# Patient Record
Sex: Male | Born: 1955 | Race: White | Hispanic: No | Marital: Married | Smoking: Never smoker
Health system: Southern US, Community
[De-identification: ages and names within clinical notes are randomized; demographics above are authoritative.]

## PROBLEM LIST (undated history)

## (undated) DIAGNOSIS — S62509A Fracture of unspecified phalanx of unspecified thumb, initial encounter for closed fracture: Secondary | ICD-10-CM

## (undated) DIAGNOSIS — I2510 Atherosclerotic heart disease of native coronary artery without angina pectoris: Secondary | ICD-10-CM

## (undated) DIAGNOSIS — F988 Other specified behavioral and emotional disorders with onset usually occurring in childhood and adolescence: Secondary | ICD-10-CM

## (undated) DIAGNOSIS — H3320 Serous retinal detachment, unspecified eye: Secondary | ICD-10-CM

## (undated) DIAGNOSIS — M5030 Other cervical disc degeneration, unspecified cervical region: Secondary | ICD-10-CM

## (undated) DIAGNOSIS — Z8249 Family history of ischemic heart disease and other diseases of the circulatory system: Secondary | ICD-10-CM

## (undated) DIAGNOSIS — N200 Calculus of kidney: Secondary | ICD-10-CM

## (undated) DIAGNOSIS — M5136 Other intervertebral disc degeneration, lumbar region: Secondary | ICD-10-CM

## (undated) DIAGNOSIS — N400 Enlarged prostate without lower urinary tract symptoms: Secondary | ICD-10-CM

## (undated) DIAGNOSIS — M51369 Other intervertebral disc degeneration, lumbar region without mention of lumbar back pain or lower extremity pain: Secondary | ICD-10-CM

## (undated) DIAGNOSIS — E7800 Pure hypercholesterolemia, unspecified: Secondary | ICD-10-CM

## (undated) DIAGNOSIS — R7303 Prediabetes: Secondary | ICD-10-CM

## (undated) DIAGNOSIS — Z8639 Personal history of other endocrine, nutritional and metabolic disease: Secondary | ICD-10-CM

## (undated) DIAGNOSIS — R896 Abnormal cytological findings in specimens from other organs, systems and tissues: Secondary | ICD-10-CM

## (undated) DIAGNOSIS — E875 Hyperkalemia: Secondary | ICD-10-CM

## (undated) DIAGNOSIS — R310 Gross hematuria: Secondary | ICD-10-CM

## (undated) DIAGNOSIS — R350 Frequency of micturition: Secondary | ICD-10-CM

## (undated) HISTORY — DX: Calculus of kidney: N20.0

## (undated) HISTORY — DX: Benign prostatic hyperplasia without lower urinary tract symptoms: N40.0

## (undated) HISTORY — DX: Family history of ischemic heart disease and other diseases of the circulatory system: Z82.49

## (undated) HISTORY — DX: Atherosclerotic heart disease of native coronary artery without angina pectoris: I25.10

## (undated) HISTORY — DX: Pure hypercholesterolemia, unspecified: E78.00

## (undated) HISTORY — DX: Serous retinal detachment, unspecified eye: H33.20

## (undated) HISTORY — DX: Hyperkalemia: E87.5

## (undated) HISTORY — DX: Other specified behavioral and emotional disorders with onset usually occurring in childhood and adolescence: F98.8

## (undated) HISTORY — DX: Fracture of unspecified phalanx of unspecified thumb, initial encounter for closed fracture: S62.509A

## (undated) HISTORY — PX: BIOPSY: SHX5522

## (undated) HISTORY — PX: OTHER SURGICAL HISTORY: SHX169

---

## 1992-09-23 HISTORY — PX: OTHER SURGICAL HISTORY: SHX169

## 1998-01-11 ENCOUNTER — Other Ambulatory Visit: Admission: RE | Admit: 1998-01-11 | Discharge: 1998-01-11 | Payer: Self-pay | Admitting: Gastroenterology

## 2010-10-24 HISTORY — PX: RETINAL DETACHMENT SURGERY: SHX105

## 2011-07-18 ENCOUNTER — Ambulatory Visit (HOSPITAL_COMMUNITY)
Admission: RE | Admit: 2011-07-18 | Discharge: 2011-07-18 | Disposition: A | Discharge status: Home / Self Care | Payer: PRIVATE HEALTH INSURANCE | Source: Ambulatory Visit | Attending: Urology | Admitting: Urology

## 2011-07-18 ENCOUNTER — Other Ambulatory Visit: Payer: Self-pay | Admitting: Urology

## 2011-07-18 DIAGNOSIS — R059 Cough, unspecified: Secondary | ICD-10-CM

## 2011-07-18 DIAGNOSIS — R05 Cough: Secondary | ICD-10-CM

## 2011-07-18 DIAGNOSIS — R079 Chest pain, unspecified: Secondary | ICD-10-CM | POA: Insufficient documentation

## 2011-07-18 DIAGNOSIS — M948X9 Other specified disorders of cartilage, unspecified sites: Secondary | ICD-10-CM | POA: Insufficient documentation

## 2011-07-25 HISTORY — PX: REFRACTIVE SURGERY: SHX103

## 2011-08-26 ENCOUNTER — Other Ambulatory Visit: Payer: Self-pay | Admitting: Urology

## 2011-08-27 ENCOUNTER — Encounter (HOSPITAL_BASED_OUTPATIENT_CLINIC_OR_DEPARTMENT_OTHER): Payer: Self-pay | Admitting: *Deleted

## 2011-08-27 NOTE — Progress Notes (Signed)
NPO AFTER MN. PT ARRIVES AT 1130. ( DR Laster TAKES HE WILL BE SEEING PT'S AT OFFICE DOS, SO POSS. FEW MIN'S LAST). NEEDS HG AND EKG.

## 2011-08-28 DIAGNOSIS — N200 Calculus of kidney: Secondary | ICD-10-CM | POA: Diagnosis present

## 2011-08-28 DIAGNOSIS — R972 Elevated prostate specific antigen [PSA]: Secondary | ICD-10-CM | POA: Diagnosis present

## 2011-08-28 DIAGNOSIS — R310 Gross hematuria: Secondary | ICD-10-CM | POA: Diagnosis present

## 2011-08-29 NOTE — H&P (Signed)
istory of Present Illness  Jerry Kirby is a 55 year old male patient who has a history of nephrolithiasis. He has passed stones on double occasions. Recently, after a long bike ride he experienced an episode of painless, gross hematuria. That occurred only one time but his urinalysis has revealed microscopic hematuria which has persisted. He has undergone multiple imaging studies including CT scans and ultrasounds which have revealed 3 stones in the left kidney a small stone in the right kidney and bilateral simple cysts. He has recently noted a mild shooting sensation down the urethra into the area of the tip of the penis and thought this could potentially be secondary to a stone either passing or lodged within the prostatic urethra. He does not have other voiding symptoms. He does have some prostatic calcification in the central zone to the right of the midline on previous CT scans.  His PSA had been slowly rising but well within the normal range. In 1/10 it had risen to 1.66. It was then noted to have decreased to 1.31 in 11/11 after a period of weight loss. His most recent PSA in 11/12 was noted to be elevated further to 2.35 however he had been undertaking vigorous rowing exercises on a daily basis prior to having his PSA performed. He has subsequently avoided further rowing exercises in order to determine if this was the cause of his slight PSA elevation. He does have a family history of prostate cancer in his father.   Past Medical History Problems  1. History of  Acute Sinusitis 461.9 2. History of  Hypercholesterolemia 272.0 3. History of  Retinal Detachment Left 361.9  Surgical History Problems  1. History of  Neurorrhaphy Left Thumb 2. History of  Repair Of Retinal Detachment By Laser Right 3. History of  Repair Retinal Detachment With Scleral Buckling With Implant Left 4. History of  Surgery Of Male Genitalia Vasectomy V25.2  Current Meds 1. Nystatin-Triamcinolone 100000-0.1 UNIT/GM-%  External Cream; APPLY SPARINGLY TO  AFFECTED AREA(S) TWICE DAILY; Therapy: 26Aug2008 to (Evaluate:03Sep2008); Last  Rx:26Aug2008  Allergies Medication  1. No Known Drug Allergies  Family History Problems  1. Maternal history of  Breast Cancer V16.3 2. Paternal history of  Coronary Artery Disease V17.49 3. Paternal history of  Malignant Melanoma Of The Skin V16.8 4. Paternal history of  Prostate Cancer V16.42  Social History Problems    Alcohol Use 1-2 drinks 3-5 nights weekly   Caffeine Use 2-5 daily   Marital History - Currently Married   Never A Smoker  Review of Systems Genitourinary, constitutional, skin, eye, otolaryngeal, hematologic/lymphatic, cardiovascular, pulmonary, endocrine, musculoskeletal, gastrointestinal, neurological and psychiatric system(s) were reviewed and pertinent findings if present are noted.  Genitourinary: hematuria and penile pain.  Musculoskeletal: shoulder pain.    Vitals Vital Signs [Data Includes: Last 1 Day]  12Nov2012 08:12AM  BMI Calculated: 25.33 BSA Calculated: 2.07 Height: 6 ft  Weight: 187 lb  Blood Pressure: 125 / 74 Temperature: 98.6 F Heart Rate: 74  Physical Exam: General appearance: alert and appears stated age Head: Normocephalic, without obvious abnormality, atraumatic Eyes: conjunctivae/corneas clear. EOM's intact.  Oropharynx: moist mucous membranes Neck: supple, symmetrical, trachea midline Resp: normal respiratory effort Cardio: regular rate and rhythm Back: symmetric, no curvature. ROM normal. No CVA tenderness. GI: soft, non-tender; bowel sounds normal; no masses,  no organomegaly Rectal: Rectal exam demonstrates an external hemorrhoid, but normal sphincter tone, no tenderness and no masses. The prostate is smooth and flat . Estimated prostate size is 1+. The prostate  has no nodularity and is not tender. The left seminal vesicle is nonpalpable. The right seminal vesicle is nonpalpable. The perineum is normal on  inspection.  Extremities: extremities normal, atraumatic, no cyanosis or edema Skin: Skin color normal. No visible rashes or lesions Neurologic: Grossly normal  Impression/plan: Since he has had gross hematuria as well as to cytology is that showed atypia we have discussed further evaluation with cystoscopy, bilateral retrograde pyelograms and bilateral ureteroscopy. He has known left renal calculi which I will address ureteroscopically. Their size will undoubtedly necessitate laser lithotripsy. I will also be able to ureteroscopically evaluate the collecting system and ureter for a possible source of gross hematuria other than his stones.In addition his PSA has increased significantly and although he has an unremarkable DRE I have recommended further evaluation with transrectal ultrasound and biopsy of the prostate. We discussed these procedures in detail and he is fully informed of the risks, complications and alternatives as well as probability of success.

## 2011-08-30 ENCOUNTER — Other Ambulatory Visit: Payer: Self-pay | Admitting: Urology

## 2011-08-30 ENCOUNTER — Encounter (HOSPITAL_BASED_OUTPATIENT_CLINIC_OR_DEPARTMENT_OTHER)
Admission: RE | Disposition: A | Discharge status: Home / Self Care | Payer: Self-pay | Source: Ambulatory Visit | Attending: Urology

## 2011-08-30 ENCOUNTER — Encounter (HOSPITAL_BASED_OUTPATIENT_CLINIC_OR_DEPARTMENT_OTHER): Payer: Self-pay | Admitting: Anesthesiology

## 2011-08-30 ENCOUNTER — Ambulatory Visit (HOSPITAL_BASED_OUTPATIENT_CLINIC_OR_DEPARTMENT_OTHER)
Admission: RE | Admit: 2011-08-30 | Discharge: 2011-08-30 | Disposition: A | Discharge status: Home / Self Care | Payer: PRIVATE HEALTH INSURANCE | Source: Ambulatory Visit | Attending: Urology | Admitting: Urology

## 2011-08-30 ENCOUNTER — Encounter (HOSPITAL_BASED_OUTPATIENT_CLINIC_OR_DEPARTMENT_OTHER): Payer: Self-pay | Admitting: *Deleted

## 2011-08-30 ENCOUNTER — Ambulatory Visit (HOSPITAL_BASED_OUTPATIENT_CLINIC_OR_DEPARTMENT_OTHER): Payer: PRIVATE HEALTH INSURANCE | Admitting: Anesthesiology

## 2011-08-30 DIAGNOSIS — N200 Calculus of kidney: Secondary | ICD-10-CM | POA: Diagnosis present

## 2011-08-30 DIAGNOSIS — N135 Crossing vessel and stricture of ureter without hydronephrosis: Secondary | ICD-10-CM | POA: Insufficient documentation

## 2011-08-30 DIAGNOSIS — R310 Gross hematuria: Secondary | ICD-10-CM | POA: Diagnosis present

## 2011-08-30 DIAGNOSIS — R972 Elevated prostate specific antigen [PSA]: Secondary | ICD-10-CM | POA: Diagnosis present

## 2011-08-30 DIAGNOSIS — R6889 Other general symptoms and signs: Secondary | ICD-10-CM | POA: Insufficient documentation

## 2011-08-30 DIAGNOSIS — E7800 Pure hypercholesterolemia, unspecified: Secondary | ICD-10-CM | POA: Insufficient documentation

## 2011-08-30 HISTORY — DX: Abnormal cytological findings in specimens from other organs, systems and tissues: R89.6

## 2011-08-30 HISTORY — DX: Other intervertebral disc degeneration, lumbar region without mention of lumbar back pain or lower extremity pain: M51.369

## 2011-08-30 HISTORY — DX: Other intervertebral disc degeneration, lumbar region: M51.36

## 2011-08-30 HISTORY — PX: CYSTOSCOPY W/ URETERAL STENT PLACEMENT: SHX1429

## 2011-08-30 HISTORY — DX: Other cervical disc degeneration, unspecified cervical region: M50.30

## 2011-08-30 HISTORY — DX: Frequency of micturition: R35.0

## 2011-08-30 HISTORY — DX: Calculus of kidney: N20.0

## 2011-08-30 HISTORY — DX: Gross hematuria: R31.0

## 2011-08-30 HISTORY — PX: PROSTATE BIOPSY: SHX241

## 2011-08-30 LAB — POCT HEMOGLOBIN-HEMACUE: Hemoglobin: 15.1 g/dL (ref 13.0–17.0)

## 2011-08-30 SURGERY — CYSTOSCOPY, WITH RETROGRADE PYELOGRAM AND URETERAL STENT INSERTION
Anesthesia: General | Site: Ureter | Wound class: Clean Contaminated

## 2011-08-30 MED ORDER — LIDOCAINE HCL 2 % EX GEL
CUTANEOUS | Status: DC | PRN
  Administered 2011-08-30: 1

## 2011-08-30 MED ORDER — TAPENTADOL HCL 50 MG PO TABS
50.0000 mg | ORAL_TABLET | Freq: Once | ORAL | Status: AC
  Administered 2011-08-30: 50 mg via ORAL

## 2011-08-30 MED ORDER — STERILE WATER FOR IRRIGATION IR SOLN
Status: DC | PRN
  Administered 2011-08-30: 3000 mL

## 2011-08-30 MED ORDER — PIPERACILLIN-TAZOBACTAM 3.375 G IVPB 30 MIN
3.3750 g | Freq: Once | INTRAVENOUS | Status: AC
  Administered 2011-08-30: 3.375 g via INTRAVENOUS

## 2011-08-30 MED ORDER — DEXAMETHASONE SODIUM PHOSPHATE 4 MG/ML IJ SOLN
INTRAMUSCULAR | Status: DC | PRN
  Administered 2011-08-30: 8 mg via INTRAVENOUS

## 2011-08-30 MED ORDER — LIDOCAINE HCL (CARDIAC) 20 MG/ML IV SOLN
INTRAVENOUS | Status: DC | PRN
  Administered 2011-08-30: 60 mg via INTRAVENOUS

## 2011-08-30 MED ORDER — GENTAMICIN IN SALINE 0.8-0.9 MG/ML-% IV SOLN
80.0000 mg | Freq: Once | INTRAVENOUS | Status: AC
  Administered 2011-08-30: 80 mg via INTRAVENOUS

## 2011-08-30 MED ORDER — ACETAMINOPHEN 10 MG/ML IV SOLN: 1000.0000 mg | Freq: Four times a day (QID) | INTRAVENOUS | Status: DC

## 2011-08-30 MED ORDER — PHENAZOPYRIDINE HCL 200 MG PO TABS: 200.0000 mg | ORAL_TABLET | Freq: Three times a day (TID) | ORAL | Status: AC | PRN

## 2011-08-30 MED ORDER — IOHEXOL 350 MG/ML SOLN
INTRAVENOUS | Status: DC | PRN
  Administered 2011-08-30: 50 mL

## 2011-08-30 MED ORDER — FENTANYL CITRATE 0.05 MG/ML IJ SOLN
INTRAMUSCULAR | Status: DC | PRN
  Administered 2011-08-30: 25 ug via INTRAVENOUS
  Administered 2011-08-30 (×3): 50 ug via INTRAVENOUS
  Administered 2011-08-30 (×2): 25 ug via INTRAVENOUS
  Administered 2011-08-30: 50 ug via INTRAVENOUS
  Administered 2011-08-30 (×3): 25 ug via INTRAVENOUS

## 2011-08-30 MED ORDER — LACTATED RINGERS IV SOLN
INTRAVENOUS | Status: DC
  Administered 2011-08-30 (×2): via INTRAVENOUS
  Administered 2011-08-30: 100 mL/h via INTRAVENOUS
  Administered 2011-08-30: 14:00:00 via INTRAVENOUS

## 2011-08-30 MED ORDER — LEVOFLOXACIN 500 MG PO TABS: 500.0000 mg | ORAL_TABLET | Freq: Every day | ORAL | Status: AC

## 2011-08-30 MED ORDER — TAPENTADOL HCL 75 MG PO TABS: 75.0000 mg | ORAL_TABLET | Freq: Four times a day (QID) | ORAL | Status: DC | PRN

## 2011-08-30 MED ORDER — MIDAZOLAM HCL 5 MG/5ML IJ SOLN
INTRAMUSCULAR | Status: DC | PRN
  Administered 2011-08-30: 2 mg via INTRAVENOUS

## 2011-08-30 MED ORDER — KETOROLAC TROMETHAMINE 30 MG/ML IJ SOLN
30.0000 mg | Freq: Once | INTRAMUSCULAR | Status: AC
  Administered 2011-08-30: 30 mg via INTRAVENOUS

## 2011-08-30 MED ORDER — SODIUM CHLORIDE 0.9 % IR SOLN
Status: DC | PRN
  Administered 2011-08-30: 500 mL

## 2011-08-30 MED ORDER — BELLADONNA ALKALOIDS-OPIUM 16.2-60 MG RE SUPP
RECTAL | Status: DC | PRN
  Administered 2011-08-30: 1 via RECTAL

## 2011-08-30 MED ORDER — SODIUM CHLORIDE 0.9 % IR SOLN
Status: DC | PRN
  Administered 2011-08-30: 6000 mL

## 2011-08-30 MED ORDER — ACETAMINOPHEN 10 MG/ML IV SOLN
1000.0000 mg | Freq: Once | INTRAVENOUS | Status: AC
  Administered 2011-08-30: 1000 mg via INTRAVENOUS

## 2011-08-30 MED ORDER — PROPOFOL 10 MG/ML IV EMUL
INTRAVENOUS | Status: DC | PRN
  Administered 2011-08-30: 200 mg via INTRAVENOUS

## 2011-08-30 MED ORDER — FENTANYL CITRATE 0.05 MG/ML IJ SOLN
25.0000 ug | INTRAMUSCULAR | Status: DC | PRN
  Administered 2011-08-30: 25 ug via INTRAVENOUS
  Administered 2011-08-30: 50 ug via INTRAVENOUS
  Administered 2011-08-30: 25 ug via INTRAVENOUS

## 2011-08-30 MED ORDER — PROMETHAZINE HCL 25 MG/ML IJ SOLN: 6.2500 mg | INTRAMUSCULAR | Status: DC | PRN

## 2011-08-30 MED ORDER — PHENAZOPYRIDINE HCL 200 MG PO TABS
200.0000 mg | ORAL_TABLET | Freq: Once | ORAL | Status: AC
  Administered 2011-08-30: 200 mg via ORAL

## 2011-08-30 MED ORDER — OXYBUTYNIN CHLORIDE 5 MG PO TABS
5.0000 mg | ORAL_TABLET | Freq: Once | ORAL | Status: AC
  Administered 2011-08-30: 5 mg via ORAL

## 2011-08-30 MED ORDER — ONDANSETRON HCL 4 MG/2ML IJ SOLN
INTRAMUSCULAR | Status: DC | PRN
  Administered 2011-08-30: 4 mg via INTRAVENOUS

## 2011-08-30 MED ORDER — TAMSULOSIN HCL 0.4 MG PO CAPS
0.4000 mg | ORAL_CAPSULE | Freq: Once | ORAL | Status: AC
  Administered 2011-08-30: 0.4 mg via ORAL

## 2011-08-30 SURGICAL SUPPLY — 34 items
ADAPTER CATH URET PLST 4-6FR (CATHETERS) ×1 IMPLANT
ADPR CATH URET STRL DISP 4-6FR (CATHETERS) ×3
BAG DRAIN URO-CYSTO SKYTR STRL (DRAIN) ×4 IMPLANT
BAG DRN UROCATH (DRAIN) ×3
BASKET ZERO TIP NITINOL 2.4FR (BASKET) ×1 IMPLANT
BSKT STON RTRVL ZERO TP 2.4FR (BASKET) ×3
CANISTER SUCT LVC 12 LTR MEDI- (MISCELLANEOUS) IMPLANT
CATH INTERMIT  6FR 70CM (CATHETERS) IMPLANT
CATH URET 5FR 28IN CONE TIP (BALLOONS) ×1
CATH URET 5FR 70CM CONE TIP (BALLOONS) IMPLANT
CLOTH BEACON ORANGE TIMEOUT ST (SAFETY) ×4 IMPLANT
CONT SPEC 4OZ CLIKSEAL STRL BL (MISCELLANEOUS) ×3 IMPLANT
DRAPE CAMERA CLOSED 9X96 (DRAPES) ×4 IMPLANT
DRESSING TELFA 8X3 (GAUZE/BANDAGES/DRESSINGS) ×3 IMPLANT
GLOVE BIO SURGEON STRL SZ8 (GLOVE) ×4 IMPLANT
GOWN PREVENTION PLUS LG XLONG (DISPOSABLE) ×5 IMPLANT
GOWN STRL REIN XL XLG (GOWN DISPOSABLE) ×4 IMPLANT
GUIDEWIRE 0.038 PTFE COATED (WIRE) ×4 IMPLANT
GUIDEWIRE ANG ZIPWIRE 038X150 (WIRE) ×1 IMPLANT
GUIDEWIRE STR DUAL SENSOR (WIRE) ×1 IMPLANT
IV NS IRRIG 3000ML ARTHROMATIC (IV SOLUTION) ×2 IMPLANT
KIT BALLIN UROMAX 15FX10 (LABEL) IMPLANT
LASER FIBER DISP (UROLOGICAL SUPPLIES) ×2 IMPLANT
NS IRRIG 500ML POUR BTL (IV SOLUTION) ×1 IMPLANT
PACK CYSTOSCOPY (CUSTOM PROCEDURE TRAY) ×4 IMPLANT
SET HIGH PRES BAL DIL (LABEL) ×1
SHEATH ACCESS URETERAL 38CM (SHEATH) ×1 IMPLANT
STENT POLARIS 5FRX26 (STENTS) ×2 IMPLANT
SURGILUBE 2OZ TUBE FLIPTOP (MISCELLANEOUS) ×4 IMPLANT
SYRINGE IRR TOOMEY STRL 70CC (SYRINGE) ×1 IMPLANT
TOWEL OR 17X24 6PK STRL BLUE (TOWEL DISPOSABLE) ×4 IMPLANT
UNDERPAD 30X30 INCONTINENT (UNDERPADS AND DIAPERS) ×4 IMPLANT
WATER STERILE IRR 3000ML UROMA (IV SOLUTION) ×1 IMPLANT
WATER STERILE IRR 500ML POUR (IV SOLUTION) ×4 IMPLANT

## 2011-08-30 NOTE — Progress Notes (Signed)
Glasses returned to pt 

## 2011-08-30 NOTE — Op Note (Signed)
PATIENT:  Jerry Kirby  PRE-OPERATIVE DIAGNOSIS: 1. Gross hematuria. 2. Elevated PSA. 3. Bilateral renal calculi. 4. Abnormal cytology.  POST-OPERATIVE DIAGNOSIS: Same and right ureteral stricture  PROCEDURE: 1. Transrectal ultrasound and biopsy of the prostate. 2. Cystoscopy with bilateral retrograde pyelograms including interpretation. 3. Bilateral ureteroscopy. 4. Bilateral renal stone extraction with left renallaser lithotripsy. 5. Bilateral ureteral stent placement. 6. Bladder biopsy. 7. Right ureteral stricture.  SURGEON: Garnett Farm, MD  INDICATION: The patient is a 55 year old male who has experienced a recent significant elevation of his PSA. He was found on DRE to have no abnormality. A course of antibiotics has not resulted in any significant decrease of his PSA despite the fact that he was having some very mild perineal and penile urethral symptoms suggesting possible prostatitis. In addition he has had 2 episodes of gross hematuria. He does have a history of calculus disease and has passed stones previously however he has not seen any stones past recently associated or subsequent to his episodes of gross hematuria. A CT scan was obtained that revealed a very subtle irregularity of the collecting system on the right-hand side but otherwise no other abnormality of the collecting systems or ureters were identified. He has bilateral stones but has 3 stones in his left kidney and has passed multiple stones all from the left-hand side. He is brought to the operating room today for evaluation of his prostate with transrectal ultrasound and biopsy, evaluation of the bladder and ureters with cystoscopy and bilateral retrograde pyelography, evaluation of the intrarenal collecting systems with bilateral ureteroscopy and treatment of his left renal calculi.  ANESTHESIA:  General  EBL:  Minimal  DRAINS: 5 French, 26 cm Polaris stent in each ureter (no string)  SPECIMEN:  1. Cold cup  biopsy of posterior wall of the bladder. 2. Barbotage specimen from the bladder for cytology. 3. Barbotage specimen from the right renal pelvis and left renal pelvis for cytology. 4. Prostate biopsy needle cores to pathology.  DISPOSITION OF SPECIMEN:  PATHOLOGY  DESCRIPTION OF PROCEDURE: The patient was taken to the major OR and while on the stretcher was administered general LMA anesthesia. An official time out was then performed.  He was then moved to the left lateral decubitus position. The transrectal ultrasound probe was placed in the rectum and real-time ultrasonography was performed. I noted the prostate appeared entirely normal with some slight hypertrophy of the transition zone but no hyper or hypoechoic lesions in the peripheral zone. There was an area of calcification in the apex of the prostate on the right-hand side. The prostate volume measured 41.53 cc with a width of 5.20 cm a height of 3.37 cm and length of 4.52 cm. Transrectal ultrasound guidance was then used to perform prostate biopsies. 12 cores were obtained in the standard fashion with an extra core being obtained from the right apical region through the area of calcification. This was well-tolerated with no bleeding noted at the end of the procedure.  Initially the 22 French cystoscope with 12 lens was passed under direct vision down the urethra with no abnormality of the urethra being noted. The prostatic urethra had no stones or abnormality. There was some slight elongation of the prostatic urethra. Bladder was then entered and systematically inspected. There were no tumors stones or inflammatory lesions seen. The ureteral orifices were noted the abnormal configuration and position. There was an area on the posterior wall the bladder that just appeared slightly redder than the surrounding mucosa and therefore  I elected to proceed with a cold cup biopsy of this area. The cold cup biopsy forceps were passed into the bladder and a  biopsy of the lesion which measured approximately 2-3 mm was performed. I then used a Bugbee electrode to fulgurate the area.  Before performing this after I had initially pass the cystoscope into the bladder I noted the bladder was relatively full of urine and therefore I used a Toomey syringe and barbotaged the bladder. This specimen was then sent for cytology.  A retrograde pyelogram was performed by injecting full-strength contrast up the left ureter under direct fluoroscopic control. It revealed No abnormality of the ureter throughout its entire length up to the area of the renal pelvis which was noted to be normal in the calyceal system appeared normal and I could not identify any definite filling defects or mass effect. I then performed an identical procedure on the right-hand side again using a 6 [place] open-ended ureteral catheter and noted the ureter and renal collecting system appeared entirely normal. I then passed a 0.038 inch floppy-tipped guidewire through the open-ended catheter up the right ureter. I passed the open-ended catheter over the guidewire into the area of the renal pelvis and removed all of the contrast in the area the renal pelvis. I then used approximately 10 cc of saline and barbotaged the right renal pelvis and this was sent for cytology. I left the guidewire in place and turned my attention to the left ureteral orifice. A open-ended catheter was then passed into the orifice, a guidewire up the ureter and the open-ended stent into the area the renal pelvis. I removed all of the contrast from the renal pelvis and barbotaged the renal pelvis with 10 cc of saline and sent this also for cytology.  I then used the inner portion of ureteral access sheath to gently dilate the intramural ureter after which I passed the 6 French flexible ureteroscope over the guidewire into the left ureter and was able to pass about three quarters of the way up the ureter but was unable to get it beyond  this location. I was able to visualize the area and it appeared to be slightly narrowed but there appeared to be no abnormality of the mucosa. It appeared that this was a mild stricture and I was unable to dilate it with the access sheath. I therefore left the guidewire in place and passed a balloon dilator over the guidewire and beyond the area of stricturing and inflated the balloon. A wasting area of the balloon was identified as I feel this and it was documented with fluoroscopic images. I then left the guidewire in place and removed the dilating balloon and was easily able to pass the ureteroscope over the guidewire into the right kidney. I removed the guidewire and then performed a systematic inspection of the entire collecting system as well as renal pelvis. I entered each calyx and noted no abnormality in a middle pole calyx the stone was identified. It completed my survey of all the calyces and the renal pelvis as well as UPJ region and noted no abnormality. I therefore passed a Nitinol basket through the ureteroscope and was able to grasp the stone and extract it without difficulty. With the guidewire in place I backloaded the cystoscope over the guidewire and then passed the stent over the guidewire into the area the renal pelvis. As I removed the guidewire a good curl was noted in the area the renal pelvis.  I then  directed my attention to the left ureter. A guidewire was passed near the renal pelvis and the ureteral access sheath was then passed over the guidewire and up the ureter. The inner portion of the access sheath as well as guidewire were removed and I passed the ureteroscope through the access sheath into the renal pelvis. I was able to visualize the renal pelvis and noted no abnormality. I then did a systematic inspection of each calyx and found no abnormality in any of the calyces to suggest any form of neoplastic process. I did note a stone in a middle pole calyx as well as a second middle  pole calyx and what appeared to be a lower pole calyx. I passed a 200 Micron holmium laser fiber through the ureteroscope and was able to fragment 2 of the largest stones. I then passed a Nitinol basket through the ureteroscope and extracted the large portions of the stone. I also extracted the third stone which was smaller and I was able to remove it completely. I then reinspected each calyx and noted submillimeter fragments of stone remaining but otherwise all significant stone fragments had been removed. There was no significant bleeding noted. I therefore passed the guidewire through the ureteroscope into the renal pelvis, removed the ureteroscope and backloaded the cystoscope. I then passed a second ureteral stent over the guidewire into the renal pelvis and is a remove the guidewire on the left-hand side there was good curl noted on that side as well. The bladder was then drained and the cystoscope was removed.  I then placed 2% lidocaine jelly in the urethra and applied a penile clamp. The patient was awakened and taken to recovery room in stable and satisfactory condition. He tolerated the procedure well with no intraoperative complications.  PLAN OF CARE: Discharge to home after PACU  PATIENT DISPOSITION:  PACU - hemodynamically stable.

## 2011-08-30 NOTE — Anesthesia Preprocedure Evaluation (Signed)
Anesthesia Evaluation  Patient identified by MRN, date of birth, ID band Patient awake    Reviewed: Allergy & Precautions, H&P , NPO status , Patient's Chart, lab work & pertinent test results  Airway Mallampati: II TM Distance: >3 FB Neck ROM: Full    Dental No notable dental hx.    Pulmonary neg pulmonary ROS,  clear to auscultation  Pulmonary exam normal       Cardiovascular neg cardio ROS Regular Normal    Neuro/Psych Negative Neurological ROS  Negative Psych ROS   GI/Hepatic negative GI ROS, Neg liver ROS,   Endo/Other  Negative Endocrine ROS  Renal/GU negative Renal ROS  Genitourinary negative   Musculoskeletal negative musculoskeletal ROS (+)   Abdominal   Peds negative pediatric ROS (+)  Hematology negative hematology ROS (+)   Anesthesia Other Findings   Reproductive/Obstetrics negative OB ROS                           Anesthesia Physical Anesthesia Plan  ASA: I  Anesthesia Plan: General   Post-op Pain Management:    Induction: Intravenous  Airway Management Planned: LMA  Additional Equipment:   Intra-op Plan:   Post-operative Plan: Extubation in OR  Informed Consent: I have reviewed the patients History and Physical, chart, labs and discussed the procedure including the risks, benefits and alternatives for the proposed anesthesia with the patient or authorized representative who has indicated his/her understanding and acceptance.   Dental advisory given  Plan Discussed with: CRNA  Anesthesia Plan Comments:         Anesthesia Quick Evaluation  

## 2011-08-30 NOTE — Anesthesia Procedure Notes (Signed)
Procedure Name: LMA Insertion Date/Time: 08/30/2011 12:38 PM Performed by: Renella Cunas D Pre-anesthesia Checklist: Patient identified, Emergency Drugs available, Suction available and Patient being monitored Patient Re-evaluated:Patient Re-evaluated prior to inductionOxygen Delivery Method: Circle System Utilized Preoxygenation: Pre-oxygenation with 100% oxygen Intubation Type: IV induction Ventilation: Mask ventilation without difficulty LMA: LMA inserted LMA Size: 4.0 Number of attempts: 1 Placement Confirmation: positive ETCO2 Tube secured with: Tape Dental Injury: Teeth and Oropharynx as per pre-operative assessment

## 2011-08-30 NOTE — Progress Notes (Signed)
asisted oob to br.  Unable to void.  C/o spasms in rlg.  Fentanyl given after return to recliner chair.  Will monitor.

## 2011-08-30 NOTE — Anesthesia Postprocedure Evaluation (Signed)
  Anesthesia Post-op Note  Patient: Jerry Kirby  Procedure(s) Performed:  CYSTOSCOPY WITH RETROGRADE PYELOGRAM/URETERAL STENT PLACEMENT; PROSTATE BIOPSY; BIOPSY - Bladder Biopsy; HOLMIUM LASER APPLICATION  Patient Location: PACU  Anesthesia Type: General  Level of Consciousness: awake and alert   Airway and Oxygen Therapy: Patient Spontanous Breathing  Post-op Pain: mild  Post-op Assessment: Post-op Vital signs reviewed, Patient's Cardiovascular Status Stable, Respiratory Function Stable, Patent Airway and No signs of Nausea or vomiting  Post-op Vital Signs: stable  Complications: No apparent anesthesia complications

## 2011-08-30 NOTE — Progress Notes (Signed)
Reported to M. Dino Borntreger, RN as caregiver 

## 2011-08-30 NOTE — Transfer of Care (Signed)
Immediate Anesthesia Transfer of Care Note  Patient: Jerry Kirby  Procedure(s) Performed:  CYSTOSCOPY WITH RETROGRADE PYELOGRAM/URETERAL STENT PLACEMENT; PROSTATE BIOPSY; BIOPSY - Bladder Biopsy; HOLMIUM LASER APPLICATION  Patient Location: PACU  Anesthesia Type: General  Level of Consciousness: awake, oriented, sedated and patient cooperative  Airway & Oxygen Therapy: Patient Spontanous Breathing and Patient connected to face mask oxygen  Post-op Assessment: Report given to PACU RN and Post -op Vital signs reviewed and stable  Post vital signs: Reviewed and stable  Complications: No apparent anesthesia complications

## 2011-08-30 NOTE — OR Nursing (Addendum)
Stent placed in right ureter by Dr. Vernie Ammons without string.

## 2011-08-30 NOTE — Progress Notes (Signed)
Pt still  C/o severe spasms.  Dr. Vernie Ammons paged via beeper. Informed of tx wi strong urgency still.  Orders received for flomax and ditropan.  Pt assisted to br.

## 2011-08-30 NOTE — OR Nursing (Signed)
Stent placed in left ureter without string by Dr. Vernie Ammons.

## 2011-09-02 ENCOUNTER — Encounter (HOSPITAL_BASED_OUTPATIENT_CLINIC_OR_DEPARTMENT_OTHER): Payer: Self-pay | Admitting: Urology

## 2011-09-05 ENCOUNTER — Other Ambulatory Visit: Payer: Self-pay | Admitting: Urology

## 2011-09-13 ENCOUNTER — Encounter (HOSPITAL_BASED_OUTPATIENT_CLINIC_OR_DEPARTMENT_OTHER): Admission: RE | Payer: Self-pay | Source: Ambulatory Visit

## 2011-09-13 ENCOUNTER — Ambulatory Visit (HOSPITAL_BASED_OUTPATIENT_CLINIC_OR_DEPARTMENT_OTHER): Admission: RE | Admit: 2011-09-13 | Payer: PRIVATE HEALTH INSURANCE | Source: Ambulatory Visit | Admitting: Urology

## 2011-09-13 SURGERY — TRANSURETHRAL RESECTION OF THE PROSTATE WITH GYRUS INSTRUMENTS
Anesthesia: General

## 2011-11-21 ENCOUNTER — Other Ambulatory Visit: Payer: Self-pay | Admitting: Urology

## 2011-11-21 ENCOUNTER — Encounter (HOSPITAL_COMMUNITY): Payer: Self-pay | Admitting: *Deleted

## 2011-11-21 NOTE — Pre-Procedure Instructions (Signed)
Patient informed to bring blue folder, driver, insurance info, picture ID, to arrive in [facility] at 1300 on March 7.Npo for solid food at midnight, then clear liquids until 9am then npo except meds with a sip. No aspirin, ibuprofen, toradol 72 hours prior to procedure.to follow laxative instructions in blue folder. Patient verbalized understanding of instructions.

## 2011-11-27 ENCOUNTER — Encounter (HOSPITAL_COMMUNITY): Payer: Self-pay | Admitting: Pharmacy Technician

## 2011-11-27 NOTE — H&P (Signed)
History of Present Illness  Jerry Kirby is a 56 year old male patient who has a history of nephrolithiasis. He has passed stones on double occasions. Recently, after a long bike ride he experienced an episode of painless, gross hematuria. That occurred only one time but his urinalysis has revealed microscopic hematuria which has persisted. He has undergone multiple imaging studies including CT scans and ultrasounds which have revealed 3 stones in the left kidney a small stone in the right kidney and bilateral simple cysts. He has recently noted a mild shooting sensation down the urethra into the area of the tip of the penis and thought this could potentially be secondary to a stone either passing or lodged within the prostatic urethra. He does not have other voiding symptoms. He does have some prostatic calcification in the central zone to the right of the midline on previous CT scans.  His PSA had been slowly rising but well within the normal range. In 1/10 it had risen to 1.66. It was then noted to have decreased to 1.31 in 11/11 after a period of weight loss. His most recent PSA in 11/12 was noted to be elevated further to 2.35 however he had been undertaking vigorous rowing exercises on a daily basis prior to having his PSA performed. He has subsequently avoided further rowing exercises in order to determine if this was the cause of his slight PSA elevation. He does have a family history of prostate cancer in his father.   He underwent bilateral ureteroscopy with treatment and removal of bilateral nephrolithiasis as well as a prostate biopsy. Postoperatively he had a great deal of discomfort from his double-J stent and they remain indwelling only 3 days and were removed despite this fact he developed calcifications alonghis right ureteral stent and has passed some of the calcifications. They were analyze and found to be of a different composition than his original renal calculi which were calcium oxalate. The  stones that formed on his stent were found to be calcium phosphate. He has several fragnts in his upper ureter that persist and have been causing intermittent discomfort.  Past Medical History Problems  1. History of  Acute Sinusitis 461.9 2. History of  Hypercholesterolemia 272.0 3. History of  Retinal Detachment Left 361.9  Surgical History Problems  1. History of  Neurorrhaphy Left Thumb 2. History of  Repair Of Retinal Detachment By Laser Right 3. History of  Repair Retinal Detachment With Scleral Buckling With Implant Left 4. History of  Surgery Of Male Genitalia Vasectomy V25.2  Current Meds 1. Nystatin-Triamcinolone 100000-0.1 UNIT/GM-% External Cream; APPLY SPARINGLY TO  AFFECTED AREA(S) TWICE DAILY; Therapy: 26Aug2008 to (Evaluate:03Sep2008); Last  Rx:26Aug2008  Allergies Medication  1. No Known Drug Allergies  Family History Problems  1. Maternal history of  Breast Cancer V16.3 2. Paternal history of  Coronary Artery Disease V17.49 3. Paternal history of  Malignant Melanoma Of The Skin V16.8 4. Paternal history of  Prostate Cancer V16.42  Social History Problems    Alcohol Use 1-2 drinks 3-5 nights weekly   Caffeine Use 2-5 daily   Marital History - Currently Married   Never A Smoker  Review of Systems Genitourinary, constitutional, skin, eye, otolaryngeal, hematologic/lymphatic, cardiovascular, pulmonary, endocrine, musculoskeletal, gastrointestinal, neurological and psychiatric system(s) were reviewed and pertinent findings if present are noted.  Genitourinary: hematuria and penile pain.  Musculoskeletal: shoulder pain.   Physical Exam: General appearance: alert and appears stated age Head: Normocephalic, without obvious abnormality, atraumatic Eyes: conjunctivae/corneas clear. EOM's intact.  Oropharynx:  moist mucous membranes Neck: supple, symmetrical, trachea midline Resp: normal respiratory effort Cardio: regular rate and rhythm Back: symmetric,  no curvature. ROM normal. No CVA tenderness. GI: soft, non-tender; bowel sounds normal; no masses,  no organomegaly Male genitalia: penis: normal male phallus with no lesions or discharge. Testes: bilaterally descended with no masses or tenderness. no hernia Extremities: extremities normal, atraumatic, no cyanosis or edema Skin: Skin color normal. No visible rashes or lesions Neurologic: Grossly normal  Impression: Multiple small stones that have failed to progress are located in his upper right ureter we therefore have discussed treating these with lithotripsy.  Plan: Lithotripsy of his right upper ureteral calculi

## 2011-11-28 ENCOUNTER — Ambulatory Visit (HOSPITAL_COMMUNITY)
Admission: RE | Admit: 2011-11-28 | Discharge: 2011-11-28 | Disposition: A | Discharge status: Home / Self Care | Payer: PRIVATE HEALTH INSURANCE | Source: Ambulatory Visit | Attending: Urology | Admitting: Urology

## 2011-11-28 ENCOUNTER — Encounter (HOSPITAL_COMMUNITY): Payer: Self-pay

## 2011-11-28 ENCOUNTER — Encounter (HOSPITAL_COMMUNITY)
Admission: RE | Disposition: A | Discharge status: Home / Self Care | Payer: Self-pay | Source: Ambulatory Visit | Attending: Urology

## 2011-11-28 DIAGNOSIS — E7800 Pure hypercholesterolemia, unspecified: Secondary | ICD-10-CM | POA: Insufficient documentation

## 2011-11-28 DIAGNOSIS — Z8042 Family history of malignant neoplasm of prostate: Secondary | ICD-10-CM | POA: Insufficient documentation

## 2011-11-28 DIAGNOSIS — N201 Calculus of ureter: Secondary | ICD-10-CM | POA: Insufficient documentation

## 2011-11-28 SURGERY — LITHOTRIPSY, ESWL
Anesthesia: LOCAL | Laterality: Right

## 2011-11-28 MED ORDER — SODIUM CHLORIDE 0.9 % IV SOLN
INTRAVENOUS | Status: DC
  Administered 2011-11-28 (×2): via INTRAVENOUS

## 2011-11-28 MED ORDER — DIAZEPAM 5 MG PO TABS
ORAL_TABLET | ORAL | Status: AC
  Administered 2011-11-28: 10 mg via ORAL
  Filled 2011-11-28: qty 2

## 2011-11-28 MED ORDER — DIAZEPAM 5 MG PO TABS
10.0000 mg | ORAL_TABLET | ORAL | Status: AC
  Administered 2011-11-28: 10 mg via ORAL

## 2011-11-28 MED ORDER — DIPHENHYDRAMINE HCL 25 MG PO CAPS
25.0000 mg | ORAL_CAPSULE | ORAL | Status: AC
  Administered 2011-11-28: 25 mg via ORAL

## 2011-11-28 MED ORDER — HYDROMORPHONE HCL PF 1 MG/ML IJ SOLN: 0.5000 mg | INTRAMUSCULAR | Status: DC | PRN

## 2011-11-28 MED ORDER — CIPROFLOXACIN HCL 500 MG PO TABS
ORAL_TABLET | ORAL | Status: AC
  Administered 2011-11-28: 500 mg via ORAL
  Filled 2011-11-28: qty 1

## 2011-11-28 MED ORDER — CIPROFLOXACIN HCL 500 MG PO TABS
500.0000 mg | ORAL_TABLET | ORAL | Status: AC
  Administered 2011-11-28: 500 mg via ORAL

## 2011-11-28 MED ORDER — DIPHENHYDRAMINE HCL 25 MG PO CAPS
ORAL_CAPSULE | ORAL | Status: AC
  Administered 2011-11-28: 25 mg via ORAL
  Filled 2011-11-28: qty 1

## 2011-11-28 NOTE — Interval H&P Note (Signed)
History and Physical Interval Note:  11/28/2011 7:16 AM  Jerry Kirby  has presented today for surgery, with the diagnosis of Right Ureteral Stone  The various methods of treatment have been discussed with the patient and family. After consideration of risks, benefits and other options for treatment, the patient has consented to  Procedure(s) (LRB): EXTRACORPOREAL SHOCK WAVE LITHOTRIPSY (ESWL) (Right) as a surgical intervention .  The patients' history has been reviewed, patient examined, no change in status, stable for surgery.  I have reviewed the patients' chart and labs.  Questions were answered to the patient's satisfaction.     Garnett Farm

## 2011-11-28 NOTE — Progress Notes (Signed)
Denies taking dulcolax or enema

## 2011-11-29 ENCOUNTER — Encounter (HOSPITAL_COMMUNITY): Payer: Self-pay

## 2012-05-03 ENCOUNTER — Emergency Department (HOSPITAL_COMMUNITY): Payer: PRIVATE HEALTH INSURANCE

## 2012-05-03 ENCOUNTER — Emergency Department (HOSPITAL_COMMUNITY)
Admission: EM | Admit: 2012-05-03 | Discharge: 2012-05-03 | Disposition: A | Discharge status: Home / Self Care | Payer: PRIVATE HEALTH INSURANCE | Attending: Urology | Admitting: Urology

## 2012-05-03 ENCOUNTER — Encounter (HOSPITAL_COMMUNITY): Payer: Self-pay | Admitting: *Deleted

## 2012-05-03 DIAGNOSIS — R5383 Other fatigue: Secondary | ICD-10-CM | POA: Insufficient documentation

## 2012-05-03 DIAGNOSIS — N201 Calculus of ureter: Secondary | ICD-10-CM | POA: Insufficient documentation

## 2012-05-03 DIAGNOSIS — R109 Unspecified abdominal pain: Secondary | ICD-10-CM | POA: Insufficient documentation

## 2012-05-03 DIAGNOSIS — R5381 Other malaise: Secondary | ICD-10-CM | POA: Insufficient documentation

## 2012-05-03 DIAGNOSIS — Z9889 Other specified postprocedural states: Secondary | ICD-10-CM | POA: Insufficient documentation

## 2012-05-03 LAB — BASIC METABOLIC PANEL
CO2: 29 mEq/L (ref 19–32)
Calcium: 9.8 mg/dL (ref 8.4–10.5)
Chloride: 99 mEq/L (ref 96–112)
Glucose, Bld: 131 mg/dL — ABNORMAL HIGH (ref 70–99)
Potassium: 4.2 mEq/L (ref 3.5–5.1)

## 2012-05-03 LAB — CBC WITH DIFFERENTIAL/PLATELET
Basophils Absolute: 0 10*3/uL (ref 0.0–0.1)
Basophils Relative: 0 % (ref 0–1)
Eosinophils Absolute: 0 10*3/uL (ref 0.0–0.7)
HCT: 40.4 % (ref 39.0–52.0)
Hemoglobin: 14.1 g/dL (ref 13.0–17.0)
MCH: 30.7 pg (ref 26.0–34.0)
MCHC: 34.9 g/dL (ref 30.0–36.0)
Monocytes Absolute: 0.5 10*3/uL (ref 0.1–1.0)
Monocytes Relative: 5 % (ref 3–12)
Neutrophils Relative %: 85 % — ABNORMAL HIGH (ref 43–77)
RDW: 12.6 % (ref 11.5–15.5)

## 2012-05-03 LAB — URINE MICROSCOPIC-ADD ON

## 2012-05-03 LAB — URINALYSIS, ROUTINE W REFLEX MICROSCOPIC
Glucose, UA: NEGATIVE mg/dL
Leukocytes, UA: NEGATIVE
Specific Gravity, Urine: 1.027 (ref 1.005–1.030)
pH: 7.5 (ref 5.0–8.0)

## 2012-05-03 MED ORDER — HYDROMORPHONE HCL PF 1 MG/ML IJ SOLN
1.0000 mg | INTRAMUSCULAR | Status: DC | PRN
  Administered 2012-05-03: 1 mg via INTRAVENOUS
  Filled 2012-05-03: qty 1

## 2012-05-03 MED ORDER — KETOROLAC TROMETHAMINE 30 MG/ML IJ SOLN
30.0000 mg | Freq: Once | INTRAMUSCULAR | Status: AC
  Administered 2012-05-03: 30 mg via INTRAVENOUS
  Filled 2012-05-03: qty 1

## 2012-05-03 MED ORDER — ONDANSETRON 8 MG PO TBDP
8.0000 mg | ORAL_TABLET | Freq: Three times a day (TID) | ORAL | Status: AC | PRN
Start: 2012-05-03 — End: 2012-05-10

## 2012-05-03 MED ORDER — PROMETHAZINE HCL 25 MG RE SUPP: 25.0000 mg | Freq: Four times a day (QID) | RECTAL | Status: DC | PRN

## 2012-05-03 MED ORDER — SODIUM CHLORIDE 0.9 % IV SOLN
INTRAVENOUS | Status: DC
  Administered 2012-05-03: 10:00:00 via INTRAVENOUS

## 2012-05-03 MED ORDER — ONDANSETRON HCL 4 MG/2ML IJ SOLN
4.0000 mg | Freq: Four times a day (QID) | INTRAMUSCULAR | Status: DC | PRN
  Administered 2012-05-03: 4 mg via INTRAVENOUS
  Filled 2012-05-03: qty 2

## 2012-05-03 MED ORDER — HYDROMORPHONE HCL 4 MG PO TABS: 4.0000 mg | ORAL_TABLET | ORAL | Status: AC | PRN

## 2012-05-03 NOTE — ED Notes (Signed)
Dr Berneice Heinrich at bedside  Pt alert and oriented x4. Respirations even and unlabored, bilateral symmetrical rise and fall of chest. Skin warm and dry. In no acute distress. Denies needs.

## 2012-05-03 NOTE — Consult Note (Signed)
Reason for Consult:Flank Pain  Referring Physician: ER Physician  Jerry Kirby is an 56 y.o. male.  HPI:   1 -Left Ureteral Stone - 56yo M with long h/o nephrolithiasis presents with several day prodrome of left flank pain now progressed to 8/10 flank pain with nausea + emesis and CT evidence of obstructing 13mm UPJ stone. Two other much smaller intrarenal calcifications noted.   Prior stone treatments include bilateral ureteroscopy, shock wave lithotripsy, and medical management, majority of stones occuring since 08/2011.  Denies fever, chills, dysuria. No leukocytosis. GFR at baseline.  PMH sig for HLD, DJD.  Past Medical History  Diagnosis Date  . Abnormal bladder cytology   . Gross hematuria   . DDD (degenerative disc disease), cervical   . DDD (degenerative disc disease), lumbar   . Frequency of urination   . Renal stone right    Past Surgical History  Procedure Date  . Retinal detachment surgery FEB 2012    RIGHT EYE  . Refractive surgery NOV 2012    RIGHT  . Left thumg surg. 1994  . Cystoscopy w/ ureteral stent placement 08/30/2011    Procedure: CYSTOSCOPY WITH RETROGRADE PYELOGRAM/URETERAL STENT PLACEMENT;  Surgeon: Garnett Farm, MD;  Location: [facility];  Service: Urology;  Laterality: Bilateral;  . Prostate biopsy 08/30/2011    Procedure: PROSTATE BIOPSY;  Surgeon: Garnett Farm, MD;  Location: [facility];  Service: Urology;  Laterality: N/A;  . Esophageal biopsy     Procedure: BIOPSY;  Surgeon: Garnett Farm, MD;  Location: [facility];  Service: Urology;  Laterality: N/A;  Bladder Biopsy    History reviewed. No pertinent family history.  Social History:  reports that he has never smoked. He has never used smokeless tobacco. He reports that he drinks about 1.2 ounces of alcohol per week. His drug history not on file.  Allergies: No Known Allergies  Medications: I have reviewed the patient's current  medications.  Results for orders placed during the hospital encounter of 05/03/12 (from the past 48 hour(s))  CBC WITH DIFFERENTIAL     Status: Abnormal   Collection Time   05/03/12  9:45 AM      Component Value Range Comment   WBC 8.7  4.0 - 10.5 K/uL    RBC 4.60  4.22 - 5.81 MIL/uL    Hemoglobin 14.1  13.0 - 17.0 g/dL    HCT 29.5  62.1 - 30.8 %    MCV 87.8  78.0 - 100.0 fL    MCH 30.7  26.0 - 34.0 pg    MCHC 34.9  30.0 - 36.0 g/dL    RDW 65.7  84.6 - 96.2 %    Platelets 191  150 - 400 K/uL    Neutrophils Relative 85 (*) 43 - 77 %    Neutro Abs 7.3  1.7 - 7.7 K/uL    Lymphocytes Relative 10 (*) 12 - 46 %    Lymphs Abs 0.8  0.7 - 4.0 K/uL    Monocytes Relative 5  3 - 12 %    Monocytes Absolute 0.5  0.1 - 1.0 K/uL    Eosinophils Relative 0  0 - 5 %    Eosinophils Absolute 0.0  0.0 - 0.7 K/uL    Basophils Relative 0  0 - 1 %    Basophils Absolute 0.0  0.0 - 0.1 K/uL   BASIC METABOLIC PANEL     Status: Abnormal   Collection Time  05/03/12  9:45 AM      Component Value Range Comment   Sodium 136  135 - 145 mEq/L    Potassium 4.2  3.5 - 5.1 mEq/L    Chloride 99  96 - 112 mEq/L    CO2 29  19 - 32 mEq/L    Glucose, Bld 131 (*) 70 - 99 mg/dL    BUN 25 (*) 6 - 23 mg/dL    Creatinine, Ser 1.47 (*) 0.50 - 1.35 mg/dL    Calcium 9.8  8.4 - 82.9 mg/dL    GFR calc non Af Amer 52 (*) >90 mL/min    GFR calc Af Amer 60 (*) >90 mL/min   URINALYSIS, ROUTINE W REFLEX MICROSCOPIC     Status: Abnormal   Collection Time   05/03/12 10:03 AM      Component Value Range Comment   Color, Urine YELLOW  YELLOW    APPearance TURBID (*) CLEAR    Specific Gravity, Urine 1.027  1.005 - 1.030    pH 7.5  5.0 - 8.0    Glucose, UA NEGATIVE  NEGATIVE mg/dL    Hgb urine dipstick LARGE (*) NEGATIVE    Bilirubin Urine NEGATIVE  NEGATIVE    Ketones, ur NEGATIVE  NEGATIVE mg/dL    Protein, ur NEGATIVE  NEGATIVE mg/dL    Urobilinogen, UA 0.2  0.0 - 1.0 mg/dL    Nitrite NEGATIVE  NEGATIVE    Leukocytes, UA  NEGATIVE  NEGATIVE   URINE MICROSCOPIC-ADD ON     Status: Abnormal   Collection Time   05/03/12 10:03 AM      Component Value Range Comment   Squamous Epithelial / LPF RARE  RARE    WBC, UA 0-2  <3 WBC/hpf    RBC / HPF 21-50  <3 RBC/hpf    Bacteria, UA MANY (*) RARE     Ct Abdomen Pelvis Wo Contrast  05/03/2012  *RADIOLOGY REPORT*  Clinical Data: Left flank pain, known kidney stones.  CT ABDOMEN AND PELVIS WITHOUT CONTRAST  Technique:  Multidetector CT imaging of the abdomen and pelvis was performed following the standard protocol without intravenous contrast.  Comparison: 08/05/2011  Findings: Minimal dependent atelectasis lung bases.  Unenhanced liver, spleen, pancreas, and adrenal glands within normal limits.  Gallbladder unremarkable.  No intrahepatic or extrahepatic ductal dilatation.  7 x 10 x 13 mm obstructing left proximal ureteral calculus (series 2/image 39).  Moderate left hydronephrosis with surrounding perinephric stranding.  Additional 4 mm left interpolar calculus (series 2/image 27) and 2 mm left lower pole calculus (series 2/image 33).  10 mm probable hyperdense cyst in the left upper pole (series 2/image 17).  17 x 10 mm probable cyst in the posterior interpolar right kidney (series 2/image 37).  Both lesions were better evaluated on prior pre/postcontrast CT.  No right renal calculi.  No evidence of bowel obstruction.  Normal appendix.  Mild atherosclerotic calcifications of the abdominal aorta and branch vessels.  No abdominopelvic ascites.  No suspicious abdominopelvic lymphadenopathy.  Prostate is top normal in size, measuring 5.0 cm.  No distal ureteral or bladder calculi.  Bladder is underdistended.  Mild degenerative changes of the visualized thoracolumbar spine, most prominent at T12-L1 and L5-S1.  IMPRESSION: 13 mm obstructing proximal left ureteral calculus with moderate left hydronephrosis.  Two additional left renal calculi measuring up to 4 mm.  These results were called by  telephone on 05/03/2012 at 1020 hours to Dr. Sebastian Ache.  Dr Berneice Heinrich was in surgery and  the message was relayed via a nurse/aid in the OR.  Original Report Authenticated By: Charline Bills, M.D.    Review of Systems  Constitutional: Positive for malaise/fatigue. Negative for fever, chills and diaphoresis.  HENT: Negative.   Eyes: Negative.   Respiratory: Negative.   Cardiovascular: Negative.   Gastrointestinal: Positive for nausea and vomiting.  Genitourinary: Positive for flank pain. Negative for hematuria.  Musculoskeletal: Negative.   Neurological: Negative.   Endo/Heme/Allergies: Negative.   Psychiatric/Behavioral: Negative.    Blood pressure 128/68, pulse 68, temperature 99 F (37.2 C), temperature source Oral, resp. rate 25, SpO2 100.00%. Physical Exam  Constitutional: He is oriented to person, place, and time. He appears well-developed and well-nourished.       Wife at bedside  HENT:  Head: Normocephalic and atraumatic.  Eyes: Pupils are equal, round, and reactive to light.  Neck: Normal range of motion.  Cardiovascular: Normal rate.   Respiratory: Effort normal.  GI: Soft. Bowel sounds are normal. There is no rebound and no guarding.  Genitourinary:       Mild Left CVAT, No Rt CVAT or SP TTP  Musculoskeletal: Normal range of motion.  Neurological: He is alert and oriented to person, place, and time.  Skin: Skin is warm and dry.  Psychiatric: He has a normal mood and affect. His behavior is normal. Judgment and thought content normal.     Assessment/Plan:  1 -Left Ureteral Stone - We discussed options of immediate URS / Stent vs. Scheduled SWL vs. Scheduled PCNL.Pt's symptoms much improved and now stable on PO meds in ER. He has chosen to proceed with scheduled PCNL by his primary urologist Dr. Vernie Ammons, likely within then next week or two.  2 - DC Home with pain meds, nausea meds.   3 - Pt to call for refractory symptoms or fevers.  Ladine Kiper 05/03/2012,  1:08 PM

## 2012-05-03 NOTE — ED Notes (Addendum)
md manny at bedside  [phone]

## 2012-05-03 NOTE — ED Notes (Signed)
Pt reports hx of kidney stones, reports he usually can pass kidney stones without problem, but having increased pain with this stone. Left flank pain since last night 7/10, nausea/ vomitting since last night.

## 2012-05-04 ENCOUNTER — Other Ambulatory Visit: Payer: Self-pay | Admitting: Radiology

## 2012-05-04 ENCOUNTER — Other Ambulatory Visit: Payer: Self-pay | Admitting: Urology

## 2012-05-04 ENCOUNTER — Encounter (HOSPITAL_COMMUNITY): Payer: Self-pay | Admitting: Pharmacy Technician

## 2012-05-04 ENCOUNTER — Encounter (HOSPITAL_COMMUNITY): Payer: Self-pay | Admitting: *Deleted

## 2012-05-04 DIAGNOSIS — N201 Calculus of ureter: Secondary | ICD-10-CM | POA: Insufficient documentation

## 2012-05-04 DIAGNOSIS — N200 Calculus of kidney: Secondary | ICD-10-CM

## 2012-05-04 DIAGNOSIS — R310 Gross hematuria: Secondary | ICD-10-CM

## 2012-05-04 DIAGNOSIS — Z8639 Personal history of other endocrine, nutritional and metabolic disease: Secondary | ICD-10-CM

## 2012-05-04 HISTORY — DX: Gross hematuria: R31.0

## 2012-05-04 HISTORY — DX: Personal history of other endocrine, nutritional and metabolic disease: Z86.39

## 2012-05-04 NOTE — ED Provider Notes (Signed)
Pt met/seen by his urologist in ED, therefore no ED MD note/charge  Suzi Roots, MD 05/04/12 [phone]

## 2012-05-04 NOTE — Progress Notes (Signed)
05-04-12 Instructed as SDS. Pt. To report Interventional Radiology at 0715 AM. Instucted on Betasept shower PM/ AM of. Pt. To contact [phone]) or [phone] prior if questions/concerns.W. Kennon Portela

## 2012-05-05 ENCOUNTER — Other Ambulatory Visit: Payer: Self-pay | Admitting: Radiology

## 2012-05-07 NOTE — H&P (Signed)
History of Present Illness  Jerry Kirby is a 56 year old male patient who has a history of nephrolithiasis. He underwent bilateral ureteroscopy for diagnostic purposes but also had bilateral nephrolithiasis treated at the same time. I put stents in and he tolerated the stents quite poorly and therefore they were removed after only being in a couple of days. Despite this he developed stones on the stent material which, over time, he eventually passed. He did have a stone seen in the upper pole of the left kidney there appeared to be fairly small but then developed acute onset left flank pain/renal colic and went to the emergency room on 05/03/12 where he was diagnosed with a large stone located in the proximal left ureter with Hounsfield units of approximately 1500. He was managed conservatively and has had intermittent pain. He presents today for further therapy of his stone. Past Medical History  Problems  1. History of Acute Sinusitis 461.9  2. History of Hypercholesterolemia 272.0  3. 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 History of Neurorrhaphy Left Thumb  2. History of Repair Of Retinal Detachment By Laser Right  3. History of Repair Retinal Detachment With Scleral Buckling With Implant Left  4. History of Surgery Of Male Genitalia Vasectomy V25.2  Current Meds  1. Nystatin-Triamcinolone 100000-0.1 UNIT/GM-% External Cream; APPLY SPARINGLY TO  AFFECTED AREA(S) TWICE DAILY; Therapy: 26Aug2008 to (Evaluate:03Sep2008); Last  Rx:26Aug2008  Allergies  Medication  1. No Known Drug Allergies  Family History  Problems  1. Maternal history of Breast Cancer V16.3  2. Paternal history of Coronary Artery Disease V17.49  3. Paternal history of Malignant Melanoma Of The Skin V16.8  4. Paternal history of Prostate Cancer V16.42  Social History  Problems   Alcohol Use  1-2 drinks 3-5 nights weekly   Caffeine Use  2-5 daily   Marital History - Currently Married    Never A Smoker  Review of Systems  Genitourinary, constitutional, skin, eye, otolaryngeal, hematologic/lymphatic, cardiovascular, pulmonary, endocrine, musculoskeletal, gastrointestinal, neurological and psychiatric system(s) were reviewed and pertinent findings if present are noted.  Genitourinary: hematuria and penile pain.  Musculoskeletal: shoulder pain.  Vitals  Vital Signs  BMI Calculated: 25.33  BSA Calculated: 2.07  Height: 6 ft  Weight: 187 lb  Blood Pressure: 125 / 74  Temperature: 98.6 F  Heart Rate: 74  Physical Exam:  General appearance: alert and appears stated age  Head: Normocephalic, without obvious abnormality, atraumatic  Eyes: conjunctivae/corneas clear. EOM's intact.  Oropharynx: moist mucous membranes  Neck: supple, symmetrical, trachea midline  Resp: normal respiratory effort  Cardio: regular rate and rhythm  Back: symmetric, no curvature. ROM normal. No CVA tenderness.  GI: soft, non-tender; bowel sounds normal; no masses, no organomegaly  Rectal: Rectal exam demonstrates an external hemorrhoid, but normal sphincter tone, no tenderness and no masses. The prostate is smooth and flat . Estimated prostate size is 1+. The prostate has no nodularity and is not tender. The left seminal vesicle is nonpalpable. The right seminal vesicle is nonpalpable. The perineum is normal on inspection.  Extremities: extremities normal, atraumatic, no cyanosis or edema  Skin: Skin color normal. No visible rashes or lesions  Neurologic: Grossly normal   Impression/plan: We discussed the treatment options and because he tolerated stent is poorly he wanted to proceed with a percutaneous nephrolithotomy. I think that is a reasonable way to approach this very large upper ureteral stone if it remains in its current location. My hope is that  I will be able to remove this stone and access the other calyces to remove all remaining left renal calculi at the time of his procedure.  1. I will  have him come to the office for KUB in order to confirm the stone remains in the same location. This will be performed on 05/07/12. 2. If his stone still is in the proximal ureter I will plan to proceed with a left percutaneous nephrolithotomy. I have asked the interventional radiologist for access through a midpole as opposed to a lower pole.

## 2012-05-08 ENCOUNTER — Ambulatory Visit (HOSPITAL_COMMUNITY)
Admission: RE | Admit: 2012-05-08 | Discharge: 2012-05-08 | Disposition: A | Discharge status: Home / Self Care | Payer: PRIVATE HEALTH INSURANCE | Source: Ambulatory Visit | Attending: Urology | Admitting: Urology

## 2012-05-08 ENCOUNTER — Ambulatory Visit (HOSPITAL_COMMUNITY): Payer: PRIVATE HEALTH INSURANCE

## 2012-05-08 ENCOUNTER — Encounter (HOSPITAL_COMMUNITY): Payer: Self-pay | Admitting: Anesthesiology

## 2012-05-08 ENCOUNTER — Ambulatory Visit (HOSPITAL_COMMUNITY): Payer: PRIVATE HEALTH INSURANCE | Admitting: Anesthesiology

## 2012-05-08 ENCOUNTER — Encounter (HOSPITAL_COMMUNITY)
Admission: RE | Disposition: A | Discharge status: Home / Self Care | Payer: Self-pay | Source: Ambulatory Visit | Attending: Urology

## 2012-05-08 ENCOUNTER — Ambulatory Visit (HOSPITAL_COMMUNITY)
Admission: RE | Admit: 2012-05-08 | Discharge: 2012-05-09 | Disposition: A | Discharge status: Home / Self Care | Payer: PRIVATE HEALTH INSURANCE | Source: Ambulatory Visit | Attending: Urology | Admitting: Urology

## 2012-05-08 ENCOUNTER — Encounter (HOSPITAL_COMMUNITY): Payer: Self-pay

## 2012-05-08 DIAGNOSIS — N200 Calculus of kidney: Secondary | ICD-10-CM

## 2012-05-08 DIAGNOSIS — N201 Calculus of ureter: Secondary | ICD-10-CM | POA: Insufficient documentation

## 2012-05-08 DIAGNOSIS — N1330 Unspecified hydronephrosis: Secondary | ICD-10-CM | POA: Insufficient documentation

## 2012-05-08 DIAGNOSIS — Z79899 Other long term (current) drug therapy: Secondary | ICD-10-CM | POA: Insufficient documentation

## 2012-05-08 DIAGNOSIS — E7800 Pure hypercholesterolemia, unspecified: Secondary | ICD-10-CM | POA: Insufficient documentation

## 2012-05-08 HISTORY — DX: Personal history of other endocrine, nutritional and metabolic disease: Z86.39

## 2012-05-08 HISTORY — PX: NEPHROLITHOTOMY: SHX5134

## 2012-05-08 LAB — CBC
Hemoglobin: 15.7 g/dL (ref 13.0–17.0)
MCH: 31.4 pg (ref 26.0–34.0)
RBC: 5 MIL/uL (ref 4.22–5.81)
WBC: 4.9 10*3/uL (ref 4.0–10.5)

## 2012-05-08 SURGERY — NEPHROLITHOTOMY PERCUTANEOUS
Anesthesia: General | Laterality: Left | Wound class: Clean Contaminated

## 2012-05-08 MED ORDER — HYDROMORPHONE HCL 2 MG PO TABS: 4.0000 mg | ORAL_TABLET | ORAL | Status: DC | PRN

## 2012-05-08 MED ORDER — CIPROFLOXACIN IN D5W 400 MG/200ML IV SOLN
400.0000 mg | INTRAVENOUS | Status: AC
  Administered 2012-05-08: 400 mg via INTRAVENOUS
  Filled 2012-05-08: qty 200

## 2012-05-08 MED ORDER — LIDOCAINE HCL (CARDIAC) 20 MG/ML IV SOLN
INTRAVENOUS | Status: DC | PRN
  Administered 2012-05-08: 100 mg via INTRAVENOUS

## 2012-05-08 MED ORDER — FENTANYL CITRATE 0.05 MG/ML IJ SOLN
INTRAMUSCULAR | Status: AC
  Filled 2012-05-08: qty 2

## 2012-05-08 MED ORDER — FENTANYL CITRATE 0.05 MG/ML IJ SOLN
INTRAMUSCULAR | Status: AC
  Filled 2012-05-08: qty 6

## 2012-05-08 MED ORDER — MIDAZOLAM HCL 2 MG/2ML IJ SOLN
INTRAMUSCULAR | Status: AC
  Filled 2012-05-08: qty 6

## 2012-05-08 MED ORDER — IOHEXOL 350 MG/ML SOLN: 80.0000 mL | Freq: Once | INTRAVENOUS | Status: DC | PRN

## 2012-05-08 MED ORDER — MIDAZOLAM HCL 5 MG/5ML IJ SOLN
INTRAMUSCULAR | Status: AC | PRN
  Administered 2012-05-08 (×2): 1 mg via INTRAVENOUS
  Administered 2012-05-08: 2 mg via INTRAVENOUS
  Administered 2012-05-08 (×2): 1 mg via INTRAVENOUS

## 2012-05-08 MED ORDER — DEXAMETHASONE SODIUM PHOSPHATE 10 MG/ML IJ SOLN
INTRAMUSCULAR | Status: DC | PRN
  Administered 2012-05-08: 10 mg via INTRAVENOUS

## 2012-05-08 MED ORDER — SODIUM CHLORIDE 0.9 % IR SOLN
Status: DC | PRN
  Administered 2012-05-08: 7000 mL

## 2012-05-08 MED ORDER — BACITRACIN-NEOMYCIN-POLYMYXIN 400-5-5000 EX OINT: 1.0000 "application " | TOPICAL_OINTMENT | Freq: Three times a day (TID) | CUTANEOUS | Status: DC | PRN

## 2012-05-08 MED ORDER — HYOSCYAMINE SULFATE 0.125 MG SL SUBL
0.1250 mg | SUBLINGUAL_TABLET | SUBLINGUAL | Status: DC | PRN
  Filled 2012-05-08: qty 1

## 2012-05-08 MED ORDER — ZOLPIDEM TARTRATE 5 MG PO TABS
5.0000 mg | ORAL_TABLET | Freq: Every evening | ORAL | Status: DC | PRN
  Administered 2012-05-08: 5 mg via ORAL
  Filled 2012-05-08: qty 1

## 2012-05-08 MED ORDER — CIPROFLOXACIN IN D5W 200 MG/100ML IV SOLN
200.0000 mg | Freq: Two times a day (BID) | INTRAVENOUS | Status: DC
  Administered 2012-05-08 – 2012-05-09 (×2): 200 mg via INTRAVENOUS
  Filled 2012-05-08 (×3): qty 100

## 2012-05-08 MED ORDER — FENTANYL CITRATE 0.05 MG/ML IJ SOLN
INTRAMUSCULAR | Status: AC | PRN
  Administered 2012-05-08 (×2): 50 ug via INTRAVENOUS
  Administered 2012-05-08: 100 ug via INTRAVENOUS
  Administered 2012-05-08: 50 ug via INTRAVENOUS

## 2012-05-08 MED ORDER — LACTATED RINGERS IV SOLN
INTRAVENOUS | Status: DC | PRN
  Administered 2012-05-08 (×2): via INTRAVENOUS

## 2012-05-08 MED ORDER — HYDROMORPHONE HCL PF 1 MG/ML IJ SOLN
0.5000 mg | INTRAMUSCULAR | Status: DC | PRN
  Administered 2012-05-09: 1 mg via INTRAVENOUS
  Filled 2012-05-08: qty 1

## 2012-05-08 MED ORDER — IOHEXOL 300 MG/ML  SOLN
INTRAMUSCULAR | Status: DC | PRN
  Administered 2012-05-08: 20 mL

## 2012-05-08 MED ORDER — LIDOCAINE HCL 1 % IJ SOLN
INTRAMUSCULAR | Status: AC
  Filled 2012-05-08: qty 20

## 2012-05-08 MED ORDER — LACTATED RINGERS IV SOLN: INTRAVENOUS | Status: DC

## 2012-05-08 MED ORDER — PROPOFOL 10 MG/ML IV EMUL
INTRAVENOUS | Status: DC | PRN
  Administered 2012-05-08: 175 mg via INTRAVENOUS

## 2012-05-08 MED ORDER — IOHEXOL 300 MG/ML  SOLN: 75.0000 mL | Freq: Once | INTRAMUSCULAR | Status: DC | PRN

## 2012-05-08 MED ORDER — ROCURONIUM BROMIDE 100 MG/10ML IV SOLN
INTRAVENOUS | Status: DC | PRN
  Administered 2012-05-08 (×2): 10 mg via INTRAVENOUS
  Administered 2012-05-08: 50 mg via INTRAVENOUS
  Administered 2012-05-08 (×2): 10 mg via INTRAVENOUS

## 2012-05-08 MED ORDER — IOHEXOL 300 MG/ML  SOLN
INTRAMUSCULAR | Status: AC
  Filled 2012-05-08: qty 2

## 2012-05-08 MED ORDER — HYDROMORPHONE HCL PF 1 MG/ML IJ SOLN
0.2500 mg | INTRAMUSCULAR | Status: DC | PRN
  Administered 2012-05-08 (×2): 0.5 mg via INTRAVENOUS

## 2012-05-08 MED ORDER — ACETAMINOPHEN 10 MG/ML IV SOLN
INTRAVENOUS | Status: AC
  Filled 2012-05-08: qty 100

## 2012-05-08 MED ORDER — POLYETHYLENE GLYCOL 3350 17 G PO PACK
17.0000 g | PACK | Freq: Every day | ORAL | Status: DC
  Administered 2012-05-08: 17 g via ORAL
  Filled 2012-05-08 (×2): qty 1

## 2012-05-08 MED ORDER — ACETAMINOPHEN 10 MG/ML IV SOLN
INTRAVENOUS | Status: DC | PRN
  Administered 2012-05-08: 1000 mg via INTRAVENOUS

## 2012-05-08 MED ORDER — CEFAZOLIN SODIUM-DEXTROSE 2-3 GM-% IV SOLR
2.0000 g | INTRAVENOUS | Status: AC
  Administered 2012-05-08: 2 g via INTRAVENOUS

## 2012-05-08 MED ORDER — PHENAZOPYRIDINE HCL 200 MG PO TABS
ORAL_TABLET | ORAL | Status: AC
  Filled 2012-05-08: qty 1

## 2012-05-08 MED ORDER — ONDANSETRON HCL 4 MG/2ML IJ SOLN: 4.0000 mg | INTRAMUSCULAR | Status: DC | PRN

## 2012-05-08 MED ORDER — BUPIVACAINE-EPINEPHRINE (PF) 0.5% -1:200000 IJ SOLN
INTRAMUSCULAR | Status: AC
  Filled 2012-05-08: qty 10

## 2012-05-08 MED ORDER — GLYCOPYRROLATE 0.2 MG/ML IJ SOLN
INTRAMUSCULAR | Status: DC | PRN
  Administered 2012-05-08: .6 mg via INTRAVENOUS

## 2012-05-08 MED ORDER — PHENAZOPYRIDINE HCL 200 MG PO TABS
200.0000 mg | ORAL_TABLET | Freq: Four times a day (QID) | ORAL | Status: DC | PRN
Start: 2012-05-08 — End: 2012-05-09
  Administered 2012-05-08 (×2): 200 mg via ORAL
  Filled 2012-05-08: qty 1

## 2012-05-08 MED ORDER — ACETAMINOPHEN 10 MG/ML IV SOLN
1000.0000 mg | Freq: Four times a day (QID) | INTRAVENOUS | Status: DC
  Administered 2012-05-08 – 2012-05-09 (×3): 1000 mg via INTRAVENOUS
  Filled 2012-05-08 (×4): qty 100

## 2012-05-08 MED ORDER — SODIUM CHLORIDE 0.9 % IV SOLN
INTRAVENOUS | Status: DC
  Administered 2012-05-08: 15:00:00 via INTRAVENOUS

## 2012-05-08 MED ORDER — SODIUM CHLORIDE 0.9 % IV SOLN
INTRAVENOUS | Status: DC
  Administered 2012-05-08: 09:00:00 via INTRAVENOUS

## 2012-05-08 MED ORDER — NEOSTIGMINE METHYLSULFATE 1 MG/ML IJ SOLN
INTRAMUSCULAR | Status: DC | PRN
  Administered 2012-05-08: 4 mg via INTRAVENOUS

## 2012-05-08 MED ORDER — BUPIVACAINE-EPINEPHRINE 0.5% -1:200000 IJ SOLN
INTRAMUSCULAR | Status: DC | PRN
  Administered 2012-05-08: 10 mL

## 2012-05-08 MED ORDER — CEFAZOLIN SODIUM-DEXTROSE 2-3 GM-% IV SOLR
INTRAVENOUS | Status: AC
  Filled 2012-05-08: qty 50

## 2012-05-08 MED ORDER — HYDROMORPHONE HCL PF 1 MG/ML IJ SOLN
INTRAMUSCULAR | Status: AC
  Filled 2012-05-08: qty 2

## 2012-05-08 MED ORDER — ONDANSETRON HCL 4 MG/2ML IJ SOLN
INTRAMUSCULAR | Status: DC | PRN
  Administered 2012-05-08: 4 mg via INTRAVENOUS

## 2012-05-08 MED ORDER — FENTANYL CITRATE 0.05 MG/ML IJ SOLN
INTRAMUSCULAR | Status: DC | PRN
  Administered 2012-05-08 (×5): 50 ug via INTRAVENOUS
  Administered 2012-05-08: 100 ug via INTRAVENOUS

## 2012-05-08 SURGICAL SUPPLY — 55 items
APL ESCP 34 STRL LF DISP (HEMOSTASIS)
APL SKNCLS STERI-STRIP NONHPOA (GAUZE/BANDAGES/DRESSINGS) ×1
APPLICATOR SURGIFLO ENDO (HEMOSTASIS) ×1 IMPLANT
BAG URINE DRAINAGE (UROLOGICAL SUPPLIES) ×3 IMPLANT
BASKET STONE NITINOL 3FRX115MB (UROLOGICAL SUPPLIES) ×1 IMPLANT
BASKET ZERO TIP NITINOL 2.4FR (BASKET) ×2 IMPLANT
BENZOIN TINCTURE PRP APPL 2/3 (GAUZE/BANDAGES/DRESSINGS) ×5 IMPLANT
BLADE SURG 15 STRL LF DISP TIS (BLADE) ×1 IMPLANT
BLADE SURG 15 STRL SS (BLADE) ×2
BSKT STON RTRVL ZERO TP 2.4FR (BASKET) ×1
CATCHER STONE W/TUBE ADAPTER (UROLOGICAL SUPPLIES) ×1 IMPLANT
CATH FOLEY 2W COUNCIL 20FR 5CC (CATHETERS) IMPLANT
CATH FOLEY 2W COUNCIL 5CC 18FR (CATHETERS) ×1 IMPLANT
CATH FOLEY 2WAY SLVR  5CC 18FR (CATHETERS) ×1
CATH FOLEY 2WAY SLVR 5CC 18FR (CATHETERS) ×1 IMPLANT
CATH INTERMIT  6FR 70CM (CATHETERS) ×1 IMPLANT
CATH ROBINSON RED A/P 20FR (CATHETERS) IMPLANT
CATH X-FORCE N30 NEPHROSTOMY (TUBING) ×2 IMPLANT
CLOTH BEACON ORANGE TIMEOUT ST (SAFETY) ×2 IMPLANT
COVER SURGICAL LIGHT HANDLE (MISCELLANEOUS) ×2 IMPLANT
DRAPE C-ARM 42X72 X-RAY (DRAPES) ×2 IMPLANT
DRAPE CAMERA CLOSED 9X96 (DRAPES) ×2 IMPLANT
DRAPE LINGEMAN PERC (DRAPES) ×2 IMPLANT
DRAPE SURG IRRIG POUCH 19X23 (DRAPES) ×2 IMPLANT
DRSG PAD ABDOMINAL 8X10 ST (GAUZE/BANDAGES/DRESSINGS) ×4 IMPLANT
DRSG TEGADERM 8X12 (GAUZE/BANDAGES/DRESSINGS) ×4 IMPLANT
FIBER LASER TRAC TIP (UROLOGICAL SUPPLIES) ×1 IMPLANT
FLOSEAL 10ML (HEMOSTASIS) ×1 IMPLANT
GAUZE SPONGE 4X4 16PLY XRAY LF (GAUZE/BANDAGES/DRESSINGS) ×1 IMPLANT
GLOVE BIOGEL M 8.0 STRL (GLOVE) ×2 IMPLANT
GOWN STRL REIN XL XLG (GOWN DISPOSABLE) ×2 IMPLANT
GUIDEWIRE STR DUAL SENSOR (WIRE) ×2 IMPLANT
HOLDER FOLEY CATH W/STRAP (MISCELLANEOUS) ×1 IMPLANT
JUMPSUIT BLUE BOOT COVER DISP (PROTECTIVE WEAR) ×1 IMPLANT
KIT BASIN OR (CUSTOM PROCEDURE TRAY) ×2 IMPLANT
LASER FIBER DISP (UROLOGICAL SUPPLIES) IMPLANT
LASER FIBER DISP 1000U (UROLOGICAL SUPPLIES) IMPLANT
MANIFOLD NEPTUNE II (INSTRUMENTS) ×2 IMPLANT
NS IRRIG 1000ML POUR BTL (IV SOLUTION) ×1 IMPLANT
PACK BASIC VI WITH GOWN DISP (CUSTOM PROCEDURE TRAY) ×2 IMPLANT
PROBE LITHOCLAST ULTRA 3.8X403 (UROLOGICAL SUPPLIES) ×1 IMPLANT
PROBE PNEUMATIC 1.0MMX570MM (UROLOGICAL SUPPLIES) ×1 IMPLANT
SET IRRIG Y TYPE TUR BLADDER L (SET/KITS/TRAYS/PACK) ×2 IMPLANT
SET WARMING FLUID IRRIGATION (MISCELLANEOUS) ×1 IMPLANT
SHEATH ACCESS URETERAL 24CM (SHEATH) ×1 IMPLANT
SPONGE GAUZE 4X4 12PLY (GAUZE/BANDAGES/DRESSINGS) ×2 IMPLANT
SPONGE LAP 4X18 X RAY DECT (DISPOSABLE) ×2 IMPLANT
STONE CATCHER W/TUBE ADAPTER (UROLOGICAL SUPPLIES) IMPLANT
SUT SILK 2 0 30  PSL (SUTURE) ×1
SUT SILK 2 0 30 PSL (SUTURE) ×1 IMPLANT
SYR 20CC LL (SYRINGE) ×4 IMPLANT
SYRINGE 10CC LL (SYRINGE) ×2 IMPLANT
TOWEL OR NON WOVEN STRL DISP B (DISPOSABLE) ×2 IMPLANT
TRAY FOLEY CATH 14FRSI W/METER (CATHETERS) ×2 IMPLANT
TUBING CONNECTING 10 (TUBING) ×6 IMPLANT

## 2012-05-08 NOTE — H&P (Signed)
Chief Complaint: (L)kidney stone Referring Physician:Ottelin HPI: Jerry Kirby is an 56 y.o. male with prior history of stones who now has a large stone on the left side. He is set up for (L)PCN to be followed by Operative nephrolithotomy by Dr. Vernie Ammons. The pt feels well otherwise, no new c/o.  Past Medical History:  Past Medical History  Diagnosis Date  . Abnormal bladder cytology   . Gross hematuria 05-04-12    none at present time  . DDD (degenerative disc disease), cervical   . DDD (degenerative disc disease), lumbar   . Frequency of urination   . Renal stone right  . H/O hypercholesterolemia 05-04-12    no current medical interventions    Past Surgical History:  Past Surgical History  Procedure Date  . Retinal detachment surgery FEB 2012    RIGHT EYE  . Refractive surgery NOV 2012    RIGHT  . Left thumg surg. 1994  . Cystoscopy w/ ureteral stent placement 08/30/2011    Procedure: CYSTOSCOPY WITH RETROGRADE PYELOGRAM/URETERAL STENT PLACEMENT;  Surgeon: Garnett Farm, MD;  Location: [facility];  Service: Urology;  Laterality: Bilateral;  . Prostate biopsy 08/30/2011    Procedure: PROSTATE BIOPSY;  Surgeon: Garnett Farm, MD;  Location: [facility];  Service: Urology;  Laterality: N/A;  . Esophageal biopsy     Procedure: BIOPSY;  Surgeon: Garnett Farm, MD;  Location: [facility];  Service: Urology;  Laterality: N/A;  Bladder Biopsy  . `     Family History: No family history on file.  Social History:  reports that he has never smoked. He has never used smokeless tobacco. He reports that he drinks about 1.2 ounces of alcohol per week. He reports that he does not use illicit drugs.  Allergies: No Known Allergies  Medications: HYDROmorphone (DILAUDID) 4 MG tablet (Taking) Number of times this order has been changed since signing: 1 Order [address] ibuprofen (ADVIL,MOTRIN) 200 MG tablet (Taking) naproxen sodium (ANAPROX) 220 MG  tablet (Taking) ondansetron (ZOFRAN ODT) 8 MG disintegrating tablet (Taking) Number of times this order has been changed since signing: 1 Order [address] promethazine (PHENERGAN) 25 MG suppository (Taking) Number of times this order has been changed since signing: 1 Order [address] silodosin (RAPAFLO) 4 MG CAPS capsule (Taking)   Please HPI for pertinent positives, otherwise complete 10 system ROS negative.  Physical Exam: Blood pressure 137/79, pulse 65, temperature 98 F (36.7 C), temperature source Oral, resp. rate 16, height 6' (1.829 m), weight 197 lb (89.359 kg), SpO2 99.00%. Body mass index is 26.72 kg/(m^2).   General Appearance:  Alert, cooperative, no distress, appears stated age  Head:  Normocephalic, without obvious abnormality, atraumatic  ENT: Unremarkable  Neck: Supple, symmetrical, trachea midline, no adenopathy, thyroid: not enlarged, symmetric, no tenderness/mass/nodules  Lungs:   Clear to auscultation bilaterally, no w/r/r, respirations unlabored without use of accessory muscles.  Heart:  Regular rate and rhythm, S1, S2 normal, no murmur, rub or gallop. Carotids 2+ without bruit.  Abdomen:   Soft, non-tender, non distended. Bowel sounds active all four quadrants,  no masses, no organomegaly.  Neurologic: Normal affect, no gross deficits.   Results for orders placed during the hospital encounter of 05/08/12 (from the past 48 hour(s))  CBC     Status: Normal   Collection Time   05/08/12  7:30 AM      Component Value Range Comment   WBC 4.9  4.0 - 10.5 K/uL  RBC 5.00  4.22 - 5.81 MIL/uL    Hemoglobin 15.7  13.0 - 17.0 g/dL    HCT 81.1  91.4 - 78.2 %    MCV 89.8  78.0 - 100.0 fL    MCH 31.4  26.0 - 34.0 pg    MCHC 35.0  30.0 - 36.0 g/dL    RDW 95.6  21.3 - 08.6 %    Platelets 186  150 - 400 K/uL    No results found.  Assessment/Plan (L)kidney stone For (L)PCN to be followed by operative nephrolithotomy Labs pending Reviewed procedure with pt who is very  familiar. He understands risks and complications. Consent signed in chart   Brayton El PA-C 05/08/2012, 8:07 AM

## 2012-05-08 NOTE — Anesthesia Preprocedure Evaluation (Signed)

## 2012-05-08 NOTE — Anesthesia Postprocedure Evaluation (Signed)
  Anesthesia Post-op Note  Patient: Jerry Kirby  Procedure(s) Performed: Procedure(s) (LRB): NEPHROLITHOTOMY PERCUTANEOUS (Left)  Patient Location: PACU  Anesthesia Type: General  Level of Consciousness: awake and alert   Airway and Oxygen Therapy: Patient Spontanous Breathing  Post-op Pain: mild  Post-op Assessment: Post-op Vital signs reviewed, Patient's Cardiovascular Status Stable, Respiratory Function Stable, Patent Airway and No signs of Nausea or vomiting  Post-op Vital Signs: stable  Complications: No apparent anesthesia complications

## 2012-05-08 NOTE — ED Notes (Signed)
Pt transport via stretcher with RN to OP holding.

## 2012-05-08 NOTE — Op Note (Signed)
PATIENT:  Jerry Kirby  PRE-OPERATIVE DIAGNOSIS:  Left ureteral calculus  POST-OPERATIVE DIAGNOSIS:  Same  PROCEDURE:  Procedure(s): 1. Left antegrade nephrostogram with interpretation. 2. Left percutaneous nephroscopy. 3. Left antegrade ureteroscopy with laser lithotripsy and stone extraction.  SURGEON:  Garnett Farm  INDICATION: Dr. Halt is a 56 year old male with a history of calculus disease. He had a stone in the upper pole of his left kidney that over a very short period of time has increased in size significantly and over the past week moved into the left ureter causing significant left renal colic. Hydronephrosis was identified as well. Followup KUB revealed the stone remains present but is located in the mid ureter at this time. He has tolerated ureteral stents quite poorly in the past and we discussed the treatment options. He elected to proceed with an antegrade approach with hopes of avoiding the ureteral stent.  ANESTHESIA:  General  EBL:  Minimal  DRAINS: 18 French Foley catheter and 18 [place] council tip catheter as nephrostomy tube.  LOCAL MEDICATIONS USED:  10 cc of half percent Marcaine with epinephrine  SPECIMEN: Stone  DISPOSITION OF SPECIMEN:  given to patient  Description of procedure: After informed consent the patient was taken to the major OR, administered general anesthesia and then moved to the prone position. His left flank and nephrostomy catheter was then sterilely prepped and draped. An official timeout was then performed.  I initially passed a 0.038 inch floppy-tipped guidewire through his previously placed nephrostomy catheter into the bladder. I then removed the nephrostomy catheter, made a transverse skin incision and then passed a coaxial catheter over the guidewire under fluoroscopy. The inner portion of the coaxial catheter was then removed and a second guidewire was then passed through the catheter and down the ureter into the bladder to be used  as a working guidewire. I then passed the nephrostomy dilating balloon over the guidewire and into the area the renal pelvis under fluoroscopy and inflated the balloon. The 30 French nephrostomy access sheath was then passed over the balloon which was then deflated and removed.  The 37 French rigid nephroscope was then passed under direct vision through the access sheath into the area the renal pelvis. This appeared normal and no evidence of perforation was noted. I could see 2 guidewires heading down the ureter.  I passed a 6 [place] open-ended ureteral catheter through the nephroscope and down the ureter. I then left this in place and removed the nephroscope. I performed an antegrade nephrostogram by then interjecting full strength contrast through the open-ended catheter outlining the stone in the midureter as a filling defect with the remainder of the ureter appearing completely normal and a normal appearing intrarenal collecting system being noted as well. I therefore removed the open-ended catheter and proceeded with ureteroscopy.  The 6 French flexible, digital ureteroscope was then passed through the access sheath and down the left ureter to a point against the stone. I had previously backloaded the ureteral access sheath over the ureteroscope and then passed the access sheath over the ureteroscope through the nephrostomy access sheath. This was held in place with a hemostat clamped to the nephrostomy access sheath. I was able to use this to easily access the stone in the midureter. I then chose a 200  holmium laser fiber and began fragmenting the stone. As the stone began to fragment a Nitinol basket was then intermittently used to extract fragments. This was performed until the stone was completely fragmented and  all of the fragments that were visible were extracted. I then passed the ureteroscope down the ureter throughout its entire length and noted no further stone fragments. I was able to pass  the ureteroscope easily into the bladder and visualized the Foley catheter balloon. I then extracted the ureteroscope slowly back up the ureter under direct vision and noted no stone fragments, no injury or perforation to the ureter.  He had a small stone in the midpole calyx and I tried to retroflex the scope and gain access visually but was unable to so I then injected contrast to outline the midpole calyx but despite my efforts I was unable to gain access due to the fact that it was essentially a parallel calyx. There was no significant bleeding. I therefore removed the ureteroscope. I passed a 11 [place] council tip catheter through the access sheath and into the area the renal pelvis. I filled the balloon with approximately 4 cc of half strength contrast and noted with injection of contrast the tip of the catheter heading toward the upper pole in the renal pelvis. The catheter was left in place as a nephrostomy tube and the access sheath was backed up off of the catheter and cut throughout its length and removed from the catheter. The catheter was then secured to the skin with a 2-0 silk suture after I injected local anesthetic into the subcutaneous tissue of the access site. A sterile occlusive dressing was applied and the nephrostomy tube was connected to closed system drainage. The patient was awakened and taken to the recovery room in stable and satisfactory condition. He tolerated the procedure well with no intraoperative complications.  PLAN OF CARE: Discharge to home after PACU  PATIENT DISPOSITION:  PACU - hemodynamically stable.

## 2012-05-08 NOTE — ED Notes (Signed)
Omnipaque 350mg I/ml 75cc injected via PIV R hand per VO Dr. Grace Isaac by Charlsie Quest, RT

## 2012-05-08 NOTE — Procedures (Signed)
Successful placement of left sided 5 Fr catheter for PNL access.  Tip of catheter is within the urinary bladder.  No immediate complications.    

## 2012-05-08 NOTE — Transfer of Care (Signed)
Immediate Anesthesia Transfer of Care Note  Patient: Jerry Kirby  Procedure(s) Performed: Procedure(s) (LRB): NEPHROLITHOTOMY PERCUTANEOUS (Left)  Patient Location: PACU  Anesthesia Type: General  Level of Consciousness: awake, alert  and oriented  Airway & Oxygen Therapy: Patient Spontanous Breathing and Patient connected to face mask oxygen  Post-op Assessment: Report given to PACU RN and Post -op Vital signs reviewed and stable  Post vital signs: Reviewed and stable  Complications: No apparent anesthesia complications

## 2012-05-08 NOTE — Preoperative (Signed)
Beta Blockers   Reason not to administer Beta Blockers:Not Applicable 

## 2012-05-09 NOTE — Progress Notes (Signed)
Urology Progress Note  Subjective:     No acute urologic events overnight. No flatus or BM. Did not tolerate clamping of nephrostomy tube this morning. No nausea.  ROS: Negative: SOB  Objective:  Patient Vitals for the past 24 hrs:  BP Temp Temp src Pulse Resp SpO2 Height Weight  05/09/12 0530 119/61 mmHg 98.5 F (36.9 C) Oral 75  20  95 % - -  05/09/12 0100 123/72 mmHg 97.6 F (36.4 C) Oral 67  18  97 % - -  05/08/12 2130 133/75 mmHg 98 F (36.7 C) Oral 61  20  97 % - -  05/08/12 1750 142/81 mmHg 97.5 F (36.4 C) Oral 65  16  99 % - -  05/08/12 1520 - - - - - - 6' (1.829 m) 85.73 kg (189 lb)  05/08/12 1507 133/76 mmHg 97.7 F (36.5 C) Oral 54  16  99 % - -  05/08/12 1430 138/77 mmHg 97.9 F (36.6 C) - 54  13  100 % - -  05/08/12 1415 131/76 mmHg - - 50  11  100 % - -  05/08/12 1400 138/89 mmHg - - 57  14  100 % - -  05/08/12 1351 - 98.5 F (36.9 C) - 61  14  100 % - -    Physical Exam: General:  No acute distress, awake Cardiovascular:    [x]   S1/S2 present, RRR  []   Irregularly irregular Chest:  CTA-B Abdomen:               []  Soft, appropriately TTP  [x]  Soft, NTTP  []  Soft, appropriately TTP, incision(s) clean/dry/intact  Genitourinary: Left nephrostomy tube dressed without soakage. No flank ecchymosis. Left nephrostomy tube draining pink urine withtout clots. Foley:  Discontinued    I/O last 3 completed shifts: In: 1950 [I.V.:1850; IV Piggyback:100] Out: 905 [Urine:905]  Recent Labs  Basename 05/08/12 0730   HGB 15.7   WBC 4.9   PLT 186    No results found for this basename: NA:2,K:2,CL:2,CO2:2,BUN:2,CREATININE:2,CALCIUM:2,MAGNESIUM:2,GFRNONAA:2,GFRAA:2 in the last 72 hours   No results found for this basename: PT:2,INR:2,APTT:2 in the last 72 hours   No components found with this basename: ABG:2    Length of stay: 1 days.  Assessment: Urolithiasis. POD#1 Left PCNL  Plan: -Reclamp nephrostomy tube at 9:00. -Add rapaflo. -Ambulate. -Saline  lock IV once tolerating PO meds. -Likely discharge home today.   Natalia Leatherwood, MD [phone]

## 2012-05-09 NOTE — Discharge Summary (Signed)
Physician Discharge Summary  Patient ID: LOGIN MUCKLEROY MRN: [id number] DOB/AGE: 54/23/1957 55 y.o.  Admit date: 05/08/2012 Discharge date: 05/09/2012  Admission Diagnoses: Left nephrolithiasis  Discharge Diagnoses:  Left nephrolithiasis  Discharged Condition: good  Hospital Course:  Patient admitted following left PCNL for urolithiasis. He did well overnight and was able to control pain with PO medication. He did not tolerate capping of his nephrostomy tube x2. He elected to be discharged home with nephrostomy tube in place. He will try capping at home and if this is not successful he will be scheduled for left antegrade nephrostogram with nephrostomy tube removal in Interventional Radiology on Monday, 05/11/12. He was able to ambulate and tolerate a regular diet. He already has pain medication and cipro at home.    Consults: None  Significant Diagnostic Studies: None  Treatments: surgery: Left PCNL.  Discharge Exam: Blood pressure 119/61, pulse 75, temperature 98.5 F (36.9 C), temperature source Oral, resp. rate 20, height 6' (1.829 m), weight 85.73 kg (189 lb), SpO2 95.00%. See PE from progress note on discharge date.  Disposition: 01-Home or Self Care  Discharge Orders    Future Orders Please Complete By Expires   Discharge patient        Medication List  As of 05/09/2012 12:03 PM   TAKE these medications         HYDROmorphone 4 MG tablet   Commonly known as: DILAUDID   Take 1 tablet (4 mg total) by mouth every 4 (four) hours as needed for pain.      ibuprofen 200 MG tablet   Commonly known as: ADVIL,MOTRIN   Take 600 mg by mouth every 6 (six) hours as needed. pain      naproxen sodium 220 MG tablet   Commonly known as: ANAPROX   Take 220 mg by mouth 2 (two) times daily with a meal.      ondansetron 8 MG disintegrating tablet   Commonly known as: ZOFRAN-ODT   Take 1 tablet (8 mg total) by mouth every 8 (eight) hours as needed for nausea.      promethazine 25 MG  suppository   Commonly known as: PHENERGAN   Place 1 suppository (25 mg total) rectally every 6 (six) hours as needed for nausea.      silodosin 4 MG Caps capsule   Commonly known as: RAPAFLO   Take 4 mg by mouth daily with breakfast.           Follow-up Information    Follow up with Garnett Farm, MD. (As needed)    Contact information:   [address] [place] [address] [phone]          Signed: Milford Cage 05/09/2012, 12:03 PM

## 2012-05-11 ENCOUNTER — Encounter (HOSPITAL_COMMUNITY): Payer: Self-pay | Admitting: Urology

## 2012-05-11 ENCOUNTER — Ambulatory Visit (HOSPITAL_COMMUNITY)
Admission: RE | Admit: 2012-05-11 | Discharge: 2012-05-11 | Disposition: A | Discharge status: Home / Self Care | Payer: PRIVATE HEALTH INSURANCE | Source: Ambulatory Visit | Attending: Urology | Admitting: Urology

## 2012-05-11 ENCOUNTER — Other Ambulatory Visit (HOSPITAL_COMMUNITY): Payer: Self-pay | Admitting: Diagnostic Radiology

## 2012-05-11 ENCOUNTER — Other Ambulatory Visit: Payer: Self-pay | Admitting: Urology

## 2012-05-11 DIAGNOSIS — N200 Calculus of kidney: Secondary | ICD-10-CM

## 2012-05-11 DIAGNOSIS — E7800 Pure hypercholesterolemia, unspecified: Secondary | ICD-10-CM | POA: Insufficient documentation

## 2012-05-11 DIAGNOSIS — N135 Crossing vessel and stricture of ureter without hydronephrosis: Secondary | ICD-10-CM | POA: Insufficient documentation

## 2012-05-11 NOTE — Procedures (Signed)
Left nephrostogram demonstrated complete obstruction in mid ureter, likely due to edema and blood clots.  Therefore, a 26 cm nephroureteral catheter was placed.  No immediate complication.

## 2012-05-12 ENCOUNTER — Other Ambulatory Visit: Payer: Self-pay | Admitting: Urology

## 2012-05-12 DIAGNOSIS — N135 Crossing vessel and stricture of ureter without hydronephrosis: Secondary | ICD-10-CM

## 2012-05-12 DIAGNOSIS — N201 Calculus of ureter: Secondary | ICD-10-CM

## 2012-05-13 ENCOUNTER — Encounter (HOSPITAL_COMMUNITY): Payer: Self-pay | Admitting: Pharmacy Technician

## 2012-05-15 ENCOUNTER — Ambulatory Visit (HOSPITAL_COMMUNITY)
Admission: RE | Admit: 2012-05-15 | Discharge: 2012-05-15 | Disposition: A | Discharge status: Home / Self Care | Payer: PRIVATE HEALTH INSURANCE | Source: Ambulatory Visit | Attending: Urology | Admitting: Urology

## 2012-05-15 DIAGNOSIS — N201 Calculus of ureter: Secondary | ICD-10-CM

## 2012-05-15 MED ORDER — IOHEXOL 300 MG/ML  SOLN: 5.0000 mL | Freq: Once | INTRAMUSCULAR | Status: AC | PRN

## 2012-05-15 NOTE — Procedures (Signed)
L nephrostogram and PCN removal Patent L ureter. No comp

## 2013-03-07 IMAGING — CT CT ABD-PELV W/O CM
1 series · 14 of 17 positions shown, 19 images · non-contrast
Comparison: 08/05/2011

CLINICAL DATA: Left flank pain, known kidney stones.

CT ABDOMEN AND PELVIS WITHOUT CONTRAST
TECHNIQUE: Multidetector CT imaging of the abdomen and pelvis was
performed following the standard protocol without intravenous
contrast.

[Series 4: lung · axial · 0.68mm/px · z∈[-114,-44]mm · 14 of 17 slices shown, 19 images]
[im 2/17  soft-tissue]
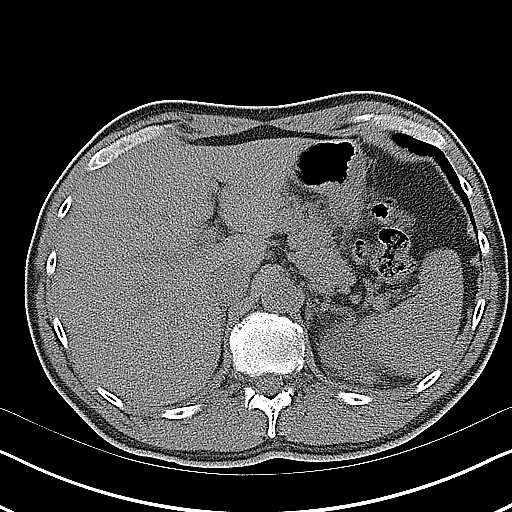
[im 2/17  bone]
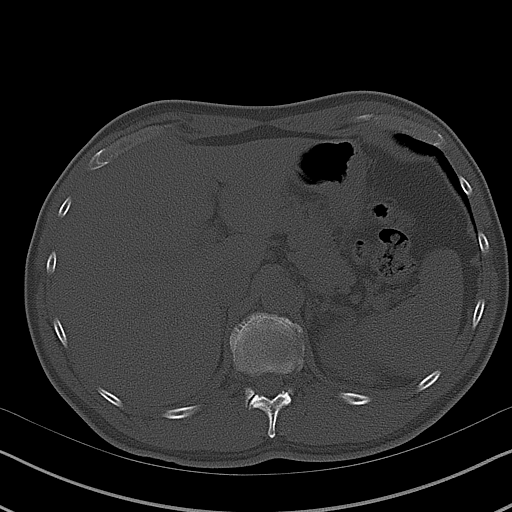
[im 3/17  soft-tissue]
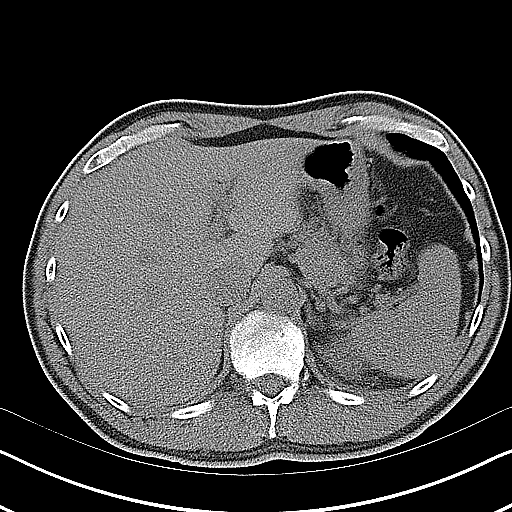
[im 4/17  soft-tissue]
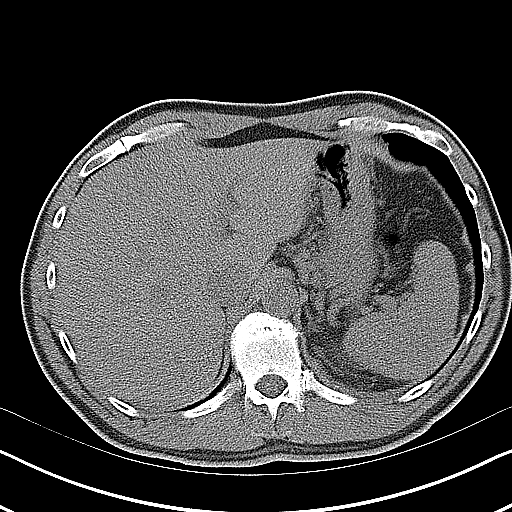
[im 6/17  soft-tissue]
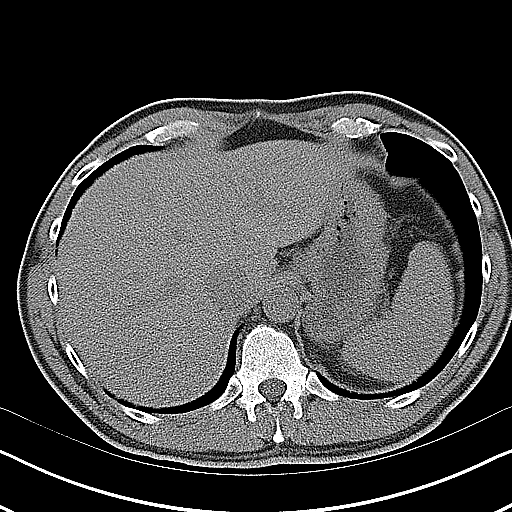
[im 7/17  soft-tissue]
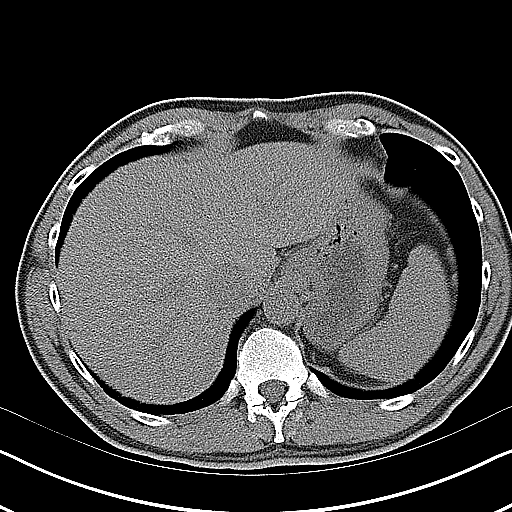
[im 8/17  soft-tissue]
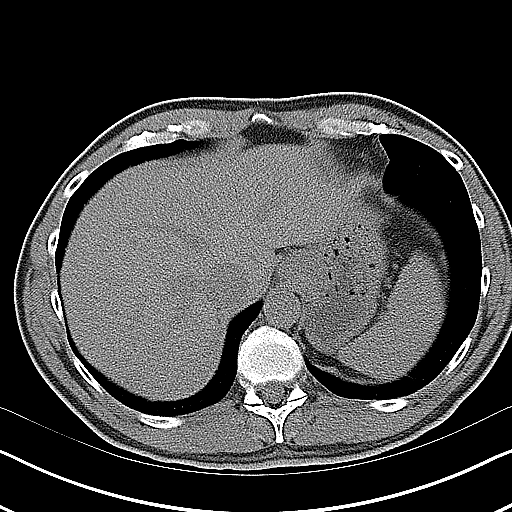
[im 9/17  soft-tissue]
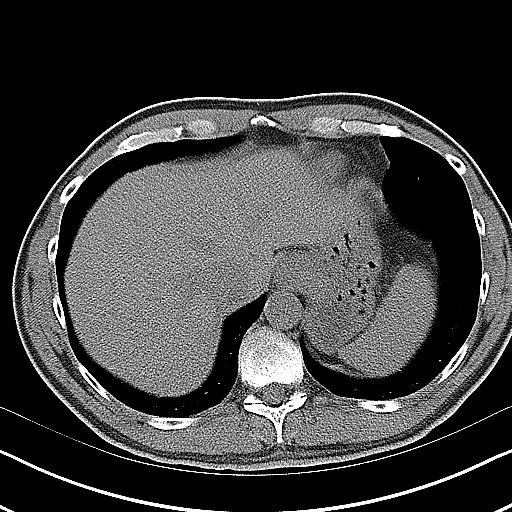
[im 10/17  soft-tissue]
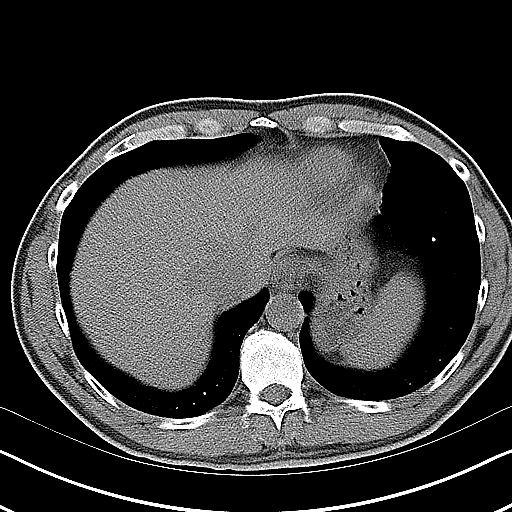
[im 11/17  soft-tissue]
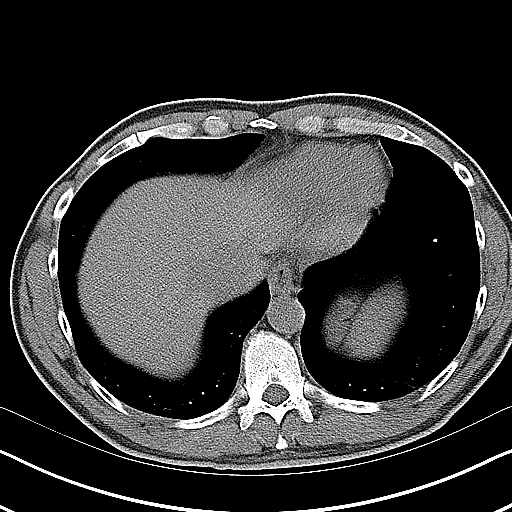
[im 11/17  bone]
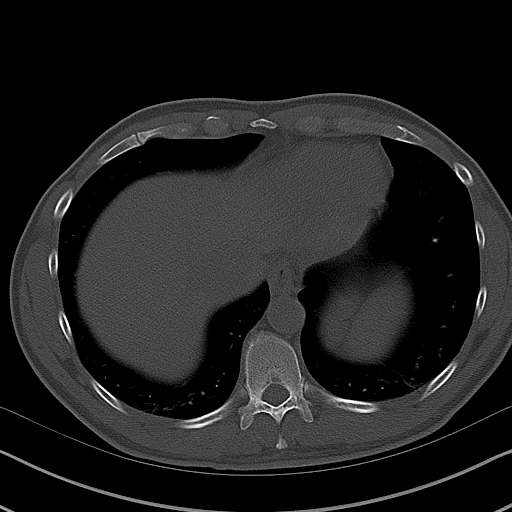
[im 12/17  soft-tissue]
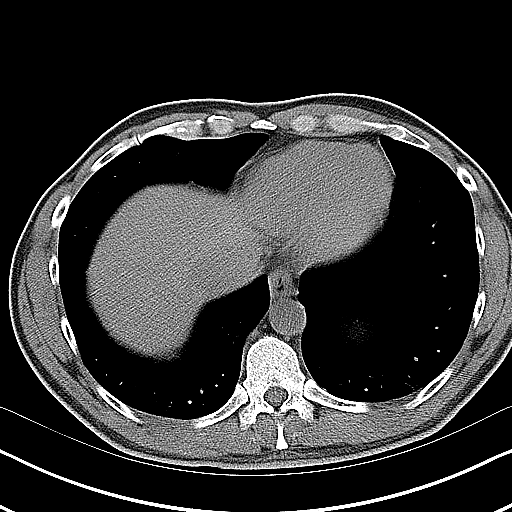
[im 13/17  lung]
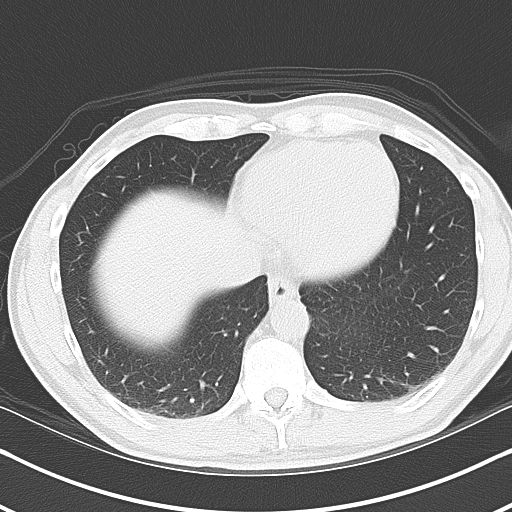
[im 14/17  soft-tissue]
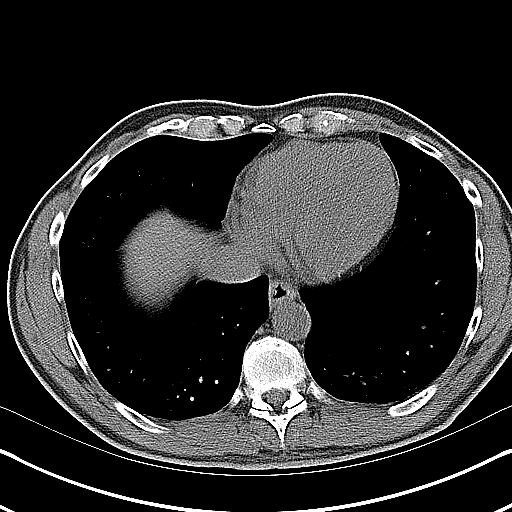
[im 14/17  lung]
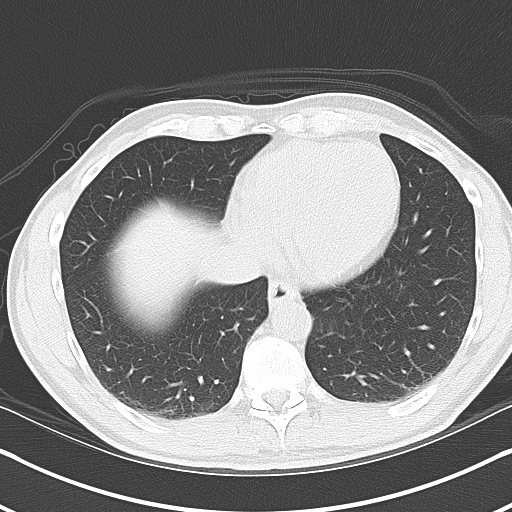
[im 15/17  soft-tissue]
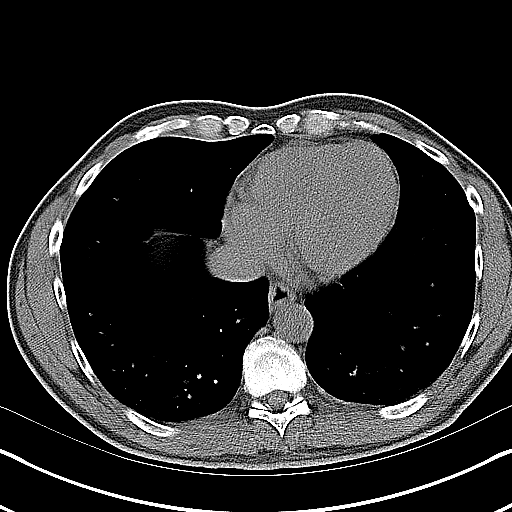
[im 15/17  lung]
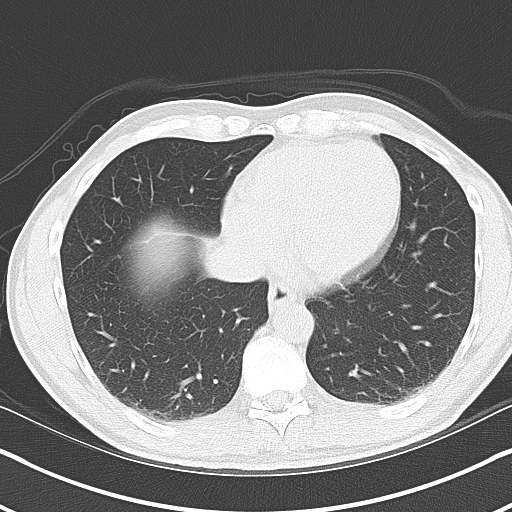
[im 16/17  soft-tissue]
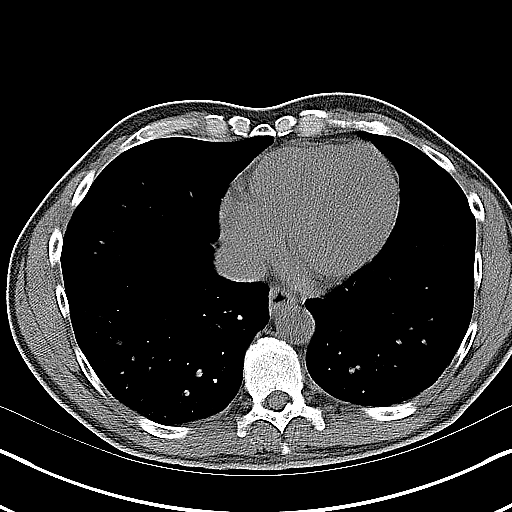
[im 16/17  lung]
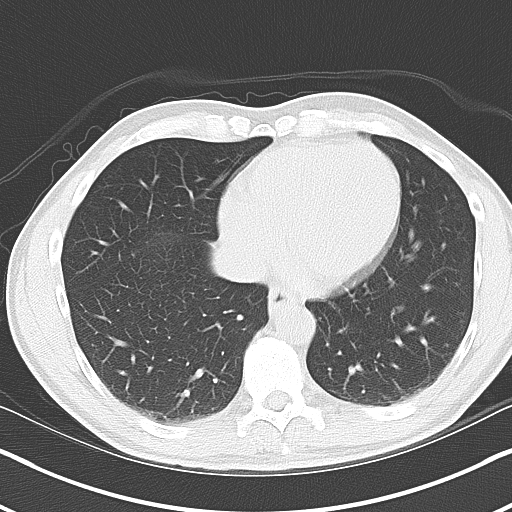

[14 of 17 positions shown; findings below may reference images not displayed]

FINDINGS: Minimal dependent atelectasis lung bases.

Unenhanced liver, spleen, pancreas, and adrenal glands within
normal limits.

Gallbladder unremarkable.  No intrahepatic or extrahepatic ductal
dilatation.

7 x 10 x 13 mm obstructing left proximal ureteral calculus (series
2/image 39).  Moderate left hydronephrosis with surrounding
perinephric stranding.  Additional 4 mm left interpolar calculus
(series 2/image 27) and 2 mm left lower pole calculus (series
2/image 33).

10 mm probable hyperdense cyst in the left upper pole (series
2/image 17).  17 x 10 mm probable cyst in the posterior interpolar
right kidney (series 2/image 37).  Both lesions were better
evaluated on prior pre/postcontrast CT.  No right renal calculi.

No evidence of bowel obstruction.  Normal appendix.

Mild atherosclerotic calcifications of the abdominal aorta and
branch vessels.

No abdominopelvic ascites.

No suspicious abdominopelvic lymphadenopathy.

Prostate is top normal in size, measuring 5.0 cm.

No distal ureteral or bladder calculi.  Bladder is underdistended.

Mild degenerative changes of the visualized thoracolumbar spine,
most prominent at T12-L1 and L5-S1.
IMPRESSION: 13 mm obstructing proximal left ureteral calculus with moderate
left hydronephrosis.

Two additional left renal calculi measuring up to 4 mm.

These results were called by telephone on 05/03/2012 at 4494 hours
to Dr. Shri Pratt.  Dr Fuller was in surgery and the message was
relayed via a nurse/aid in the OR.

## 2013-08-24 ENCOUNTER — Other Ambulatory Visit: Payer: Self-pay | Admitting: Urology

## 2013-08-27 ENCOUNTER — Encounter (HOSPITAL_COMMUNITY): Payer: Self-pay | Admitting: Pharmacy Technician

## 2013-09-01 ENCOUNTER — Other Ambulatory Visit (HOSPITAL_COMMUNITY): Payer: Self-pay | Admitting: Urology

## 2013-09-01 DIAGNOSIS — M79605 Pain in left leg: Secondary | ICD-10-CM

## 2013-09-01 DIAGNOSIS — M792 Neuralgia and neuritis, unspecified: Secondary | ICD-10-CM

## 2013-09-06 ENCOUNTER — Other Ambulatory Visit (HOSPITAL_COMMUNITY): Payer: PRIVATE HEALTH INSURANCE

## 2013-09-06 ENCOUNTER — Ambulatory Visit (HOSPITAL_COMMUNITY): Payer: PRIVATE HEALTH INSURANCE

## 2013-09-07 ENCOUNTER — Ambulatory Visit (HOSPITAL_COMMUNITY): Admission: RE | Admit: 2013-09-07 | Payer: PRIVATE HEALTH INSURANCE | Source: Ambulatory Visit

## 2013-09-07 ENCOUNTER — Encounter (HOSPITAL_COMMUNITY): Payer: Self-pay | Admitting: *Deleted

## 2013-09-07 ENCOUNTER — Ambulatory Visit (HOSPITAL_COMMUNITY): Payer: PRIVATE HEALTH INSURANCE

## 2013-09-07 NOTE — Progress Notes (Signed)
Spoke to patient via phone,history obtained,updated.  Bring blue folder,insurance cards,picture ID,designated driver and living will,POA, if desires (to be placed on chart). Reinforced no aspirin(instructions to hold aspirin per your doctor), ibuprofen products 72 hours prior to procedure. No vitamins or herbal medicines 7 days prior to procedure.   Follow laxative instructions provided by urologist (office) and in blue folder. Wear easy on/off clothing and no jewelry except wedding rings and ear rings. Leave all other valuables at home. Verbalizes understanding of instructions  Arrive at 1530, NPO 4 hours prior to procedure, no solids 6 hours prior  He will get blue folder from his office and verbalizes to follow instructions, he will get ducolax pills and take as instructed in folder

## 2013-09-13 ENCOUNTER — Ambulatory Visit (HOSPITAL_COMMUNITY): Payer: PRIVATE HEALTH INSURANCE

## 2013-09-13 ENCOUNTER — Ambulatory Visit (HOSPITAL_COMMUNITY)
Admission: RE | Admit: 2013-09-13 | Discharge: 2013-09-13 | Disposition: A | Discharge status: Home / Self Care | Payer: PRIVATE HEALTH INSURANCE | Source: Ambulatory Visit | Attending: Urology | Admitting: Urology

## 2013-09-13 ENCOUNTER — Encounter (HOSPITAL_COMMUNITY): Payer: Self-pay

## 2013-09-13 ENCOUNTER — Encounter (HOSPITAL_COMMUNITY)
Admission: RE | Disposition: A | Discharge status: Home / Self Care | Payer: Self-pay | Source: Ambulatory Visit | Attending: Urology

## 2013-09-13 DIAGNOSIS — E7800 Pure hypercholesterolemia, unspecified: Secondary | ICD-10-CM | POA: Insufficient documentation

## 2013-09-13 DIAGNOSIS — N201 Calculus of ureter: Secondary | ICD-10-CM | POA: Diagnosis present

## 2013-09-13 SURGERY — LITHOTRIPSY, ESWL
Anesthesia: LOCAL | Laterality: Left

## 2013-09-13 MED ORDER — PROMETHAZINE HCL 25 MG/ML IJ SOLN
12.5000 mg | Freq: Four times a day (QID) | INTRAMUSCULAR | Status: AC | PRN
  Administered 2013-09-13: 12.5 mg via INTRAVENOUS
  Filled 2013-09-13: qty 1

## 2013-09-13 MED ORDER — DIPHENHYDRAMINE HCL 25 MG PO CAPS
25.0000 mg | ORAL_CAPSULE | ORAL | Status: AC
  Administered 2013-09-13: 25 mg via ORAL
  Filled 2013-09-13: qty 1

## 2013-09-13 MED ORDER — SODIUM CHLORIDE 0.9 % IV SOLN
INTRAVENOUS | Status: DC
  Administered 2013-09-13 (×2): via INTRAVENOUS

## 2013-09-13 MED ORDER — DIAZEPAM 5 MG PO TABS
10.0000 mg | ORAL_TABLET | ORAL | Status: AC
  Administered 2013-09-13: 10 mg via ORAL
  Filled 2013-09-13: qty 2

## 2013-09-13 MED ORDER — HYDROMORPHONE HCL PF 1 MG/ML IJ SOLN
1.0000 mg | INTRAMUSCULAR | Status: DC
  Administered 2013-09-13: 1 mg via INTRAVENOUS
  Filled 2013-09-13: qty 1

## 2013-09-13 MED ORDER — KETOROLAC TROMETHAMINE 15 MG/ML IJ SOLN
INTRAMUSCULAR | Status: AC
  Administered 2013-09-13: 15 mg via INTRAVENOUS
  Filled 2013-09-13: qty 1

## 2013-09-13 MED ORDER — KETOROLAC TROMETHAMINE 15 MG/ML IJ SOLN
15.0000 mg | Freq: Once | INTRAMUSCULAR | Status: AC
  Administered 2013-09-13: 15 mg via INTRAVENOUS

## 2013-09-13 MED ORDER — CIPROFLOXACIN HCL 500 MG PO TABS
500.0000 mg | ORAL_TABLET | ORAL | Status: AC
  Administered 2013-09-13: 500 mg via ORAL
  Filled 2013-09-13: qty 1

## 2013-09-13 NOTE — Op Note (Signed)
See [facility] OP note scanned into chart. 

## 2013-09-13 NOTE — H&P (Signed)
History: Jerry Kirby is a 57 year old male with a history of calculus disease.  He has been experiencing mild left sided discomfort intermittently with a sensation as if there was a stone present within the left ureter.  This has been confirmed by serial KUBs which have revealed a stone in the mid left ureter.  An IVP was performed to confirm that the calcification seen was in fact in the ureter and this was found to be the case.  There appeared to be some mild narrowing of the ureter just distal to the stone which could be chronic.  There is a slight possibility that this could be due to inflammation from the stones presents however either way his stone is not passing and has remained in the same location for several weeks.  We discussed the treatment options and he has had previous stenting and tolerated this quite poorly.  The stone is small and in the midureter so I don't think access percutaneously is appropriate.  We therefore have discussed proceeding with lithotripsy of he stone. He has been on medical expulsive therapy.  Past Medical History  Problems  1. History of Acute Sinusitis 461.9  2. History of Hypercholesterolemia 272.0  3. 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 History of Neurorrhaphy Left Thumb  2. History of Repair Of Retinal Detachment By Laser Right  3. History of Repair Retinal Detachment With Scleral Buckling With Implant Left  4. History of Surgery Of Male Genitalia Vasectomy V25.2  Current Meds  1.  Zyrtec 10 mg p.r.n. 2.  Vyvanse 30 mg  3.  Rapaflo 8 mg 4.  Dilaudid 4 mg p.r.n. Allergies  Medication  1. No Known Drug Allergies  Family History  Problems  1. Maternal history of Breast Cancer V16.3  2. Paternal history of Coronary Artery Disease V17.49  3. Paternal history of Malignant Melanoma Of The Skin V16.8  4. Paternal history of Prostate Cancer V16.42  Social History  Problems   Alcohol Use  1-2 drinks 3-5 nights weekly    Caffeine Use  2-5 daily   Marital History - Currently Married   Never A Smoker  Review of Systems  Genitourinary, constitutional, skin, eye, otolaryngeal, hematologic/lymphatic, cardiovascular, pulmonary, endocrine, musculoskeletal, gastrointestinal, neurological and psychiatric system(s) were reviewed and pertinent findings if present are noted.  Genitourinary: hematuria and penile pain.  Musculoskeletal: shoulder pain.  Vitals  Vital Signs  BMI Calculated: 25.33  BSA Calculated: 2.07  Height: 6 ft  Weight: 187 lb  Blood Pressure: 125 / 74  Temperature: 98.6 F  Heart Rate: 74  Physical Exam:  General appearance: alert and appears stated age  Head: Normocephalic, without obvious abnormality, atraumatic  Eyes: conjunctivae/corneas clear. EOM's intact.  Oropharynx: moist mucous membranes  Neck: supple, symmetrical, trachea midline  Resp: normal respiratory effort  Cardio: regular rate and rhythm  Back: symmetric, no curvature. ROM normal. No CVA tenderness.  GI: soft, non-tender; bowel sounds normal; no masses, no organomegaly  Extremities: extremities normal, atraumatic, no cyanosis or edema  Skin: Skin color normal. No visible rashes or lesions  Neurologic: Grossly normal   Impression/plan: We discussed the treatment options and because he tolerated a stent poorly in the past. His stone has failed to progress despite medical expulsive therapy.  We therefore discussed proceeding with lithotripsy of his stone.  He understands contrast may be necessary to visualize the location of the stone during the procedure.  1.  He is scheduled for lithotripsy of his  left mid ureteral stone.

## 2013-09-13 NOTE — Progress Notes (Addendum)
Dr Annabell Howells becomes nauseated and c/o pain left flank. Paged Denna Haggard NP and received orders for nausea meds, pain meds, and I and O cath if needed.  2130  Additional page to Denna Haggard NP as Dr Annabell Howells would like to try torodol 15 mg IV for pain as well.

## 2013-09-15 ENCOUNTER — Ambulatory Visit (HOSPITAL_COMMUNITY)
Admission: RE | Admit: 2013-09-15 | Discharge: 2013-09-15 | Disposition: A | Discharge status: Home / Self Care | Payer: PRIVATE HEALTH INSURANCE | Source: Ambulatory Visit | Attending: Urology | Admitting: Urology

## 2013-09-15 ENCOUNTER — Ambulatory Visit (HOSPITAL_COMMUNITY): Payer: PRIVATE HEALTH INSURANCE

## 2013-09-15 DIAGNOSIS — M79609 Pain in unspecified limb: Secondary | ICD-10-CM | POA: Insufficient documentation

## 2013-09-15 DIAGNOSIS — M5020 Other cervical disc displacement, unspecified cervical region: Secondary | ICD-10-CM | POA: Insufficient documentation

## 2013-09-15 DIAGNOSIS — M792 Neuralgia and neuritis, unspecified: Secondary | ICD-10-CM

## 2013-09-15 DIAGNOSIS — M5137 Other intervertebral disc degeneration, lumbosacral region: Secondary | ICD-10-CM | POA: Insufficient documentation

## 2013-09-15 DIAGNOSIS — M51379 Other intervertebral disc degeneration, lumbosacral region without mention of lumbar back pain or lower extremity pain: Secondary | ICD-10-CM | POA: Insufficient documentation

## 2013-09-15 DIAGNOSIS — M47812 Spondylosis without myelopathy or radiculopathy, cervical region: Secondary | ICD-10-CM | POA: Insufficient documentation

## 2013-09-15 DIAGNOSIS — M5030 Other cervical disc degeneration, unspecified cervical region: Secondary | ICD-10-CM | POA: Insufficient documentation

## 2013-09-15 DIAGNOSIS — M5126 Other intervertebral disc displacement, lumbar region: Secondary | ICD-10-CM | POA: Insufficient documentation

## 2013-09-15 DIAGNOSIS — M79605 Pain in left leg: Secondary | ICD-10-CM

## 2017-10-03 ENCOUNTER — Other Ambulatory Visit: Payer: Self-pay | Admitting: Internal Medicine

## 2017-10-03 DIAGNOSIS — I25119 Atherosclerotic heart disease of native coronary artery with unspecified angina pectoris: Secondary | ICD-10-CM

## 2017-10-03 DIAGNOSIS — E7800 Pure hypercholesterolemia, unspecified: Secondary | ICD-10-CM

## 2018-12-07 ENCOUNTER — Other Ambulatory Visit: Payer: Self-pay | Admitting: Internal Medicine

## 2018-12-07 DIAGNOSIS — E785 Hyperlipidemia, unspecified: Secondary | ICD-10-CM

## 2018-12-15 ENCOUNTER — Other Ambulatory Visit: Payer: PRIVATE HEALTH INSURANCE

## 2019-02-16 ENCOUNTER — Other Ambulatory Visit: Payer: PRIVATE HEALTH INSURANCE

## 2019-06-23 ENCOUNTER — Other Ambulatory Visit: Payer: Self-pay | Admitting: Internal Medicine

## 2019-06-23 DIAGNOSIS — E7800 Pure hypercholesterolemia, unspecified: Secondary | ICD-10-CM

## 2019-06-29 ENCOUNTER — Other Ambulatory Visit: Payer: PRIVATE HEALTH INSURANCE

## 2019-07-06 ENCOUNTER — Ambulatory Visit
Admission: RE | Admit: 2019-07-06 | Discharge: 2019-07-06 | Disposition: A | Discharge status: Home / Self Care | Payer: No Typology Code available for payment source | Source: Ambulatory Visit | Attending: Internal Medicine | Admitting: Internal Medicine

## 2019-07-06 DIAGNOSIS — E7800 Pure hypercholesterolemia, unspecified: Secondary | ICD-10-CM

## 2019-12-10 ENCOUNTER — Telehealth: Payer: Self-pay

## 2019-12-10 NOTE — Telephone Encounter (Signed)
NOTES ON FILE FROM [facility] [phone], SENT REFERRAL TO SCHEDULING 

## 2020-02-01 ENCOUNTER — Other Ambulatory Visit: Payer: Self-pay

## 2020-02-01 ENCOUNTER — Encounter (INDEPENDENT_AMBULATORY_CARE_PROVIDER_SITE_OTHER): Payer: Self-pay

## 2020-02-01 ENCOUNTER — Ambulatory Visit (INDEPENDENT_AMBULATORY_CARE_PROVIDER_SITE_OTHER): Payer: PRIVATE HEALTH INSURANCE | Admitting: Cardiovascular Disease

## 2020-02-01 ENCOUNTER — Encounter: Payer: Self-pay | Admitting: Cardiovascular Disease

## 2020-02-01 VITALS — BP 124/82 | HR 58 | Ht 71.5 in | Wt 189.5 lb

## 2020-02-01 DIAGNOSIS — I2584 Coronary atherosclerosis due to calcified coronary lesion: Secondary | ICD-10-CM | POA: Diagnosis not present

## 2020-02-01 DIAGNOSIS — I2510 Atherosclerotic heart disease of native coronary artery without angina pectoris: Secondary | ICD-10-CM

## 2020-02-01 NOTE — Progress Notes (Signed)
Cardiology Office Note:    Date:  02/01/2020   ID:  Jerry Kirby, DOB March 11, 1956, MRN [id number]  PCP:  Deland Pretty, MD  Cardiologist:  Keerthana Vanrossum  Electrophysiologist:  None   Referring MD: Deland Pretty, MD   Chief Complaint  Patient presents with  . Coronary Artery Disease     History of Present Illness:    Jerry Kirby is a 64 y.o. male with a hx of hyperlipidemia and a family history of premature coronary artery disease.  We are asked to see him today by Dr. Shelia Media  for further evaluation and management of some atherosclerosis/coronary calcifications found on a coronary calcium score ()   Jerry Kirby is a very active 64 year old gentleman.  He is a competitive cyclist..  He had a coronary calcium score because of HLD.  coronary artery calcium score is 79 which is 61st percentile for patient's of matched age, gender and race/ethnicity  Remains very active. Cycles regularly ,   Has had several episodes of SVT ( or perhaps rapid sinus tach)  No CP, dyspne,   No syncope or presyncope  Has some some orthostasis .   Off and on with the rosuvastatin  Recent labs at his primary medical doctor's office reveals a total cholesterol of 162.  The triglyceride level is 51.  The HDL is 64.  The LDL is 88.   Past Medical History:  Diagnosis Date  . Abnormal bladder cytology   . ADD (attention deficit disorder)   . Atherosclerotic heart disease of native coronary artery without angina pectoris   . BPH without obstruction/lower urinary tract symptoms   . Calcification of native coronary artery   . DDD (degenerative disc disease), cervical   . DDD (degenerative disc disease), lumbar   . Family history of premature CAD   . Frequency of urination   . Gross hematuria 05-04-12   none at present time  . H/O hypercholesterolemia 05-04-12   no current medical interventions  . Hypercholesterolemia   . Hyperkalemia   . Nephrolithiasis   . Renal stone right  . Retinal detachment   . Thumb fracture      Past Surgical History:  Procedure Laterality Date  . BIOPSY     Procedure: BIOPSY;  Surgeon: Claybon Jabs, MD;  [facility];  Service: Urology;  Laterality: N/A;  Bladder Biopsy  . CYSTOSCOPY W/ URETERAL STENT PLACEMENT  08/30/2011   Procedure: CYSTOSCOPY WITH RETROGRADE PYELOGRAM/URETERAL STENT PLACEMENT;  Surgeon: Claybon Jabs, MD;  Location: [facility];  Service: Urology;  Laterality: Bilateral;  . LEFT [place]  . NEPHROLITHOTOMY  05/08/2012   Procedure: NEPHROLITHOTOMY PERCUTANEOUS;  Surgeon: Claybon Jabs, MD;  Location: [facility];  Service: Urology;  Laterality: Left;  . PROSTATE BIOPSY  08/30/2011   Procedure: PROSTATE BIOPSY;  Surgeon: Claybon Jabs, MD;  Location: [facility];  Service: Urology;  Laterality: N/A;  . REFRACTIVE SURGERY  NOV 2012   RIGHT  . RETINAL DETACHMENT SURGERY  FEB 2012   RIGHT EYE  . `      Current Medications: Current Meds  Medication Sig  . acetaminophen (TYLENOL) 500 MG tablet Take 1,000 mg by mouth every 6 (six) hours as needed for mild pain.  Marland Kitchen amphetamine-dextroamphetamine (ADDERALL) 10 MG tablet Take 10 mg by mouth as needed.   Marland Kitchen aspirin EC 81 MG tablet Take 81 mg by mouth daily.  Marland Kitchen ibuprofen (ADVIL,MOTRIN) 200 MG tablet Take 800 mg  by mouth every 6 (six) hours as needed for mild pain or moderate pain.  . rosuvastatin (CRESTOR) 10 MG tablet Take 10 mg by mouth daily.     Allergies:   Patient has no known allergies.   Social History   Socioeconomic History  . Marital status: Married    Spouse name: Not on file  . Number of children: Not on file  . Years of education: Not on file  . Highest education level: Not on file  Occupational History  . Not on file  Tobacco Use  . Smoking status: Never Smoker  . Smokeless tobacco: Never Used  Substance and Sexual Activity  . Alcohol use: Yes    Alcohol/week: 2.0 standard drinks    Types: 2 Glasses of wine per week  . Drug  use: No  . Sexual activity: Yes  Other Topics Concern  . Not on file  Social History Narrative  . Not on file   Social Determinants of Health   Financial Resource Strain:   . Difficulty of Paying Living Expenses:   Food Insecurity:   . Worried About Programme researcher, broadcasting/film/video in the Last Year:   . Barista in the Last Year:   Transportation Needs:   . Freight forwarder (Medical):   Marland Kitchen Lack of Transportation (Non-Medical):   Physical Activity:   . Days of Exercise per Week:   . Minutes of Exercise per Session:   Stress:   . Feeling of Stress :   Social Connections:   . Frequency of Communication with Friends and Family:   . Frequency of Social Gatherings with Friends and Family:   . Attends Religious Services:   . Active Member of Clubs or Organizations:   . Attends Banker Meetings:   Marland Kitchen Marital Status:      Family History: The patient's family history includes CAD in his father; Cancer in his father and mother; Healthy in his daughter and son.  ROS:   Please see the history of present illness.     All other systems reviewed and are negative.  EKGs/Labs/Other Studies Reviewed:    The following studies were reviewed today:   EKG:  EKG is  ordered today.     Feb 01, 2020: Sinus bradycardia 58 beats minute.  No ST or T wave changes.  Recent Labs: No results found for requested labs within last 8760 hours.  Recent Lipid Panel No results found for: CHOL, TRIG, HDL, CHOLHDL, VLDL, LDLCALC, LDLDIRECT  Physical Exam:    VS:  BP 124/82   Pulse (!) 58   Ht 5' 11.5" (1.816 m)   Wt 189 lb 8 oz (86 kg)   SpO2 96%   BMI 26.06 kg/m     Wt Readings from Last 3 Encounters:  02/01/20 189 lb 8 oz (86 kg)  09/13/13 190 lb (86.2 kg)  05/08/12 189 lb (85.7 kg)     GEN:  Well nourished, well developed in no acute distress HEENT: Normal NECK: No JVD; No carotid bruits LYMPHATICS: No lymphadenopathy CARDIAC: RRR, no murmurs, rubs, gallops RESPIRATORY:   Clear to auscultation without rales, wheezing or rhonchi  ABDOMEN: Soft, non-tender, non-distended MUSCULOSKELETAL:  No edema; No deformity  SKIN: Warm and dry NEUROLOGIC:  Alert and oriented x 3 PSYCHIATRIC:  Normal affect   ASSESSMENT:    1. Coronary artery calcification    PLAN:    In order of problems listed above:  1. Coronary artery calcifications: Jerry Kirby is seen  today for further evaluation of his coronary artery calcifications.  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  He does have occasional shortness of breath if he is pushing it hard or if the day is very humid.  He is on rosuvastatin for hyperlipidemia.  His last LDL is 88.  I encouraged him to continue take the rosuvastatin daily and to continue to watch his diet.  Our goal is less than 70.  I would have a low threshold to put him on a PCSK9 inhibitor if he is not able to achieve his LDL goal on high-dose statin.  If he has any symptoms I would have a very low threshold to refer him for coronary CT angiogram or even heart catheterization.  He has a very strong family history of coronary artery disease.  2.  Hyperlipidemia: He is currently on rosuvastatin 10 mg a day.  He typically has not been taking it on a daily basis because of some mild joint pain but more recently has been compliant with the medication.   Medication Adjustments/Labs and Tests Ordered: Current medicines are reviewed at length with the patient today.  Concerns regarding medicines are outlined above.  Orders Placed This Encounter  Procedures  . Lipid Profile  . Basic Metabolic Panel (BMET)  . Hepatic function panel   No orders of the defined types were placed in this encounter.   Patient Instructions  Medication Instructions:  Your physician recommends that you continue on your current medications as directed. Please refer to the Current Medication list given to you today.  *If you need a refill on your cardiac  medications before your next appointment, please call your pharmacy*   Lab Work: Your physician recommends that you return for lab work in: 3 months on the day of or a few days before your office visit with Dr. Elease Hashimoto.  You will need to FAST for this appointment - nothing to eat or drink after midnight the night before except water.   If you have labs (blood work) drawn today and your tests are completely normal, you will receive your results only by: Marland Kitchen MyChart Message (if you have MyChart) OR . A paper copy in the mail If you have any lab test that is abnormal or we need to change your treatment, we will call you to review the results.   Testing/Procedures: None Ordered   Follow-Up: At [facility], you and your health needs are our priority.  As part of our continuing mission to provide you with exceptional heart care, we have created designated Provider Care Teams.  These Care Teams include your primary Cardiologist (physician) and Advanced Practice Providers (APPs -  Physician Assistants and Nurse Practitioners) who all work together to provide you with the care you need, when you need it.  We recommend signing up for the patient portal called "MyChart".  Sign up information is provided on this After Visit Summary.  MyChart is used to connect with patients for Virtual Visits (Telemedicine).  Patients are able to view lab/test results, encounter notes, upcoming appointments, etc.  Non-urgent messages can be sent to your provider as well.   To learn more about what you can do with MyChart, go to [email or web address].    Your next appointment:   3 month(s)  The format for your next appointment:   In Person  Provider:   You may see Kristeen Miss, MD or one of the following Advanced Practice Providers on your  designated Care Team:    Tereso Newcomer, PA-C  Chelsea Aus, [place]        Signed, Kristeen Miss, MD  02/01/2020 5:31 PM    [facility]

## 2020-02-01 NOTE — Patient Instructions (Signed)
Medication Instructions:  Your physician recommends that you continue on your current medications as directed. Please refer to the Current Medication list given to you today.  *If you need a refill on your cardiac medications before your next appointment, please call your pharmacy*   Lab Work: Your physician recommends that you return for lab work in: 3 months on the day of or a few days before your office visit with Dr. Elease Hashimoto.  You will need to FAST for this appointment - nothing to eat or drink after midnight the night before except water.   If you have labs (blood work) drawn today and your tests are completely normal, you will receive your results only by: Marland Kitchen MyChart Message (if you have MyChart) OR . A paper copy in the mail If you have any lab test that is abnormal or we need to change your treatment, we will call you to review the results.   Testing/Procedures: None Ordered   Follow-Up: At [facility], you and your health needs are our priority.  As part of our continuing mission to provide you with exceptional heart care, we have created designated Provider Care Teams.  These Care Teams include your primary Cardiologist (physician) and Advanced Practice Providers (APPs -  Physician Assistants and Nurse Practitioners) who all work together to provide you with the care you need, when you need it.  We recommend signing up for the patient portal called "MyChart".  Sign up information is provided on this After Visit Summary.  MyChart is used to connect with patients for Virtual Visits (Telemedicine).  Patients are able to view lab/test results, encounter notes, upcoming appointments, etc.  Non-urgent messages can be sent to your provider as well.   To learn more about what you can do with MyChart, go to [email or web address].    Your next appointment:   3 month(s)  The format for your next appointment:   In Person  Provider:   You may see Kristeen Miss, MD or one of the  following Advanced Practice Providers on your designated Care Team:    Tereso Newcomer, PA-C  Vin Mignon, [place]

## 2020-02-02 NOTE — Addendum Note (Signed)
Addended by: Melanee Spry on: 02/02/2020 03:59 PM   Modules accepted: Orders

## 2020-04-18 ENCOUNTER — Other Ambulatory Visit: Payer: PRIVATE HEALTH INSURANCE

## 2020-04-18 ENCOUNTER — Other Ambulatory Visit: Payer: Self-pay

## 2020-04-18 DIAGNOSIS — I2584 Coronary atherosclerosis due to calcified coronary lesion: Secondary | ICD-10-CM

## 2020-04-18 DIAGNOSIS — I2510 Atherosclerotic heart disease of native coronary artery without angina pectoris: Secondary | ICD-10-CM

## 2020-04-18 LAB — LIPID PANEL
Chol/HDL Ratio: 2.5 ratio (ref 0.0–5.0)
Cholesterol, Total: 149 mg/dL (ref 100–199)
HDL: 59 mg/dL (ref >39)
LDL Chol Calc (NIH): 72 mg/dL (ref 0–99)
Triglycerides: 95 mg/dL (ref 0–149)
VLDL Cholesterol Cal: 18 mg/dL (ref 5–40)

## 2020-04-18 LAB — BASIC METABOLIC PANEL
BUN/Creatinine Ratio: 17 (ref 10–24)
BUN: 21 mg/dL (ref 8–27)
CO2: 27 mmol/L (ref 20–29)
Calcium: 9.4 mg/dL (ref 8.6–10.2)
Chloride: 101 mmol/L (ref 96–106)
Creatinine, Ser: 1.22 mg/dL (ref 0.76–1.27)
GFR calc Af Amer: 73 mL/min/{1.73_m2} (ref >59)
GFR calc non Af Amer: 63 mL/min/{1.73_m2} (ref >59)
Glucose: 106 mg/dL — ABNORMAL HIGH (ref 65–99)
Potassium: 4.4 mmol/L (ref 3.5–5.2)
Sodium: 141 mmol/L (ref 134–144)

## 2020-04-18 LAB — HEPATIC FUNCTION PANEL
ALT: 19 IU/L (ref 0–44)
AST: 20 IU/L (ref 0–40)
Albumin: 4.7 g/dL (ref 3.8–4.8)
Alkaline Phosphatase: 64 IU/L (ref 48–121)
Bilirubin Total: 0.6 mg/dL (ref 0.0–1.2)
Bilirubin, Direct: 0.19 mg/dL (ref 0.00–0.40)
Total Protein: 6.5 g/dL (ref 6.0–8.5)

## 2020-04-19 ENCOUNTER — Other Ambulatory Visit: Payer: PRIVATE HEALTH INSURANCE

## 2020-04-24 ENCOUNTER — Encounter: Payer: Self-pay | Admitting: Cardiovascular Disease

## 2020-04-24 NOTE — Progress Notes (Signed)
Cardiology Office Note:    Date:  04/25/2020   ID:  Jerry Kirby, DOB [birth year], MRN [id number]  PCP:  Merri Brunette, MD  Cardiologist:  Dorathy Stallone  Electrophysiologist:  None   Referring MD: Merri Brunette, MD   Chief Complaint  Patient presents with   Coronary Artery Disease     History of Present Illness:    Jerry Kirby is a 64 y.o. male with a hx of hyperlipidemia and a family history of premature coronary artery disease.  We are asked to see him today by Dr. Renne Crigler  for further evaluation and management of some atherosclerosis/coronary calcifications found on a coronary calcium score 79 which  is 61st percentile for age / gender matched controlls.    Jerry Kirby is a very active 64 year old gentleman.  He is a competitive cyclist..  He had a coronary calcium score because of HLD.  coronary artery calcium score is 79 which is 61st percentile for patient's of matched age, gender and race/ethnicity  Remains very active. Cycles regularly ,   Has had several episodes of SVT ( or perhaps rapid sinus tach)  No CP, dyspne,   No syncope or presyncope  Has some some orthostasis .   Off and on with the rosuvastatin  Recent labs at his primary medical doctor's office reveals a total cholesterol of 162.  The triglyceride level is 51.  The HDL is 64.  The LDL is 88.  April 25, 2020:  Jerry Kirby is seen today for follow-up visit.  He is an 64 year old gentleman with a history of hyperlipidemia and family history of premature coronary artery disease.  Coronary artery calcium score is 79 which is 61st percentile for patient's of matched age, gender and race/ethnicity  He was started on rosuvastatin 10 mg a day.  Recent labs reveal total cholesterol of 149.  The HDL is 59.  The triglyceride level is 95.  The LDL is 72.  His glucose levels 106.  Father's side of family has CAD and carotid disease.   He is building a house near [employer] [place] [employer] , [place]  We discussed getting a lipid NMR panel    Past Medical  History:  Diagnosis Date   Abnormal bladder cytology    ADD (attention deficit disorder)    Atherosclerotic heart disease of native coronary artery without angina pectoris    BPH without obstruction/lower urinary tract symptoms    Calcification of native coronary artery    DDD (degenerative disc disease), cervical    DDD (degenerative disc disease), lumbar    Family history of premature CAD    Frequency of urination    Gross hematuria 05-04-12   none at present time   H/O hypercholesterolemia 05-04-12   no current medical interventions   Hypercholesterolemia    Hyperkalemia    Nephrolithiasis    Renal stone right   Retinal detachment    Thumb fracture     Past Surgical History:  Procedure Laterality Date   BIOPSY     Procedure: BIOPSY;  Surgeon: Garnett Farm, MD;  Location: [facility];  Service: Urology;  Laterality: N/A;  Bladder Biopsy   CYSTOSCOPY W/ URETERAL STENT PLACEMENT  08/30/2011   Procedure: CYSTOSCOPY WITH RETROGRADE PYELOGRAM/URETERAL STENT PLACEMENT;  Surgeon: Garnett Farm, MD;  Location: [facility];  Service: Urology;  Laterality: Bilateral;   LEFT THUMG SURG.  1994   NEPHROLITHOTOMY  05/08/2012   Procedure: NEPHROLITHOTOMY PERCUTANEOUS;  Surgeon: Garnett Farm, MD;  Location: [facility];  Service: Urology;  Laterality: Left;   PROSTATE BIOPSY  08/30/2011   Procedure: PROSTATE BIOPSY;  Surgeon: Garnett Farm, MD;  Location: [facility];  Service: Urology;  Laterality: N/A;   REFRACTIVE SURGERY  NOV 2012   RIGHT   RETINAL DETACHMENT SURGERY  FEB 2012   RIGHT EYE   `      Current Medications: Current Meds  Medication Sig   acetaminophen (TYLENOL) 500 MG tablet Take 1,000 mg by mouth every 6 (six) hours as needed for mild pain.   amphetamine-dextroamphetamine (ADDERALL) 10 MG tablet Take 10 mg by mouth as needed.    aspirin EC 81 MG tablet Take 81 mg by mouth daily.   ibuprofen  (ADVIL,MOTRIN) 200 MG tablet Take 800 mg by mouth every 6 (six) hours as needed for mild pain or moderate pain.   rosuvastatin (CRESTOR) 10 MG tablet Take 10 mg by mouth daily.     Allergies:   Patient has no known allergies.   Social History   Socioeconomic History   Marital status: Married    Spouse name: Not on file   Number of children: Not on file   Years of education: Not on file   Highest education level: Not on file  Occupational History   Not on file  Tobacco Use   Smoking status: Never Smoker   Smokeless tobacco: Never Used  Substance and Sexual Activity   Alcohol use: Yes    Alcohol/week: 2.0 standard drinks    Types: 2 Glasses of wine per week   Drug use: No   Sexual activity: Yes  Other Topics Concern   Not on file  Social History Narrative   Not on file   Social Determinants of Health   Financial Resource Strain:    Difficulty of Paying Living Expenses:   Food Insecurity:    Worried About Programme researcher, broadcasting/film/video in the Last Year:    Barista in the Last Year:   Transportation Needs:    Freight forwarder (Medical):    Lack of Transportation (Non-Medical):   Physical Activity:    Days of Exercise per Week:    Minutes of Exercise per Session:   Stress:    Feeling of Stress :   Social Connections:    Frequency of Communication with Friends and Family:    Frequency of Social Gatherings with Friends and Family:    Attends Religious Services:    Active Member of Clubs or Organizations:    Attends Banker Meetings:    Marital Status:      Family History: The patient's family history includes CAD in his father; Cancer in his father and mother; Healthy in his daughter and son.  ROS:   Please see the history of present illness.     All other systems reviewed and are negative.  EKGs/Labs/Other Studies Reviewed:    The following studies were reviewed today:   EKG:     Recent Labs: 04/18/2020: ALT 19;  BUN 21; Creatinine, Ser 1.22; Potassium 4.4; Sodium 141  Recent Lipid Panel    Component Value Date/Time   CHOL 149 04/18/2020 0736   TRIG 95 04/18/2020 0736   HDL 59 04/18/2020 0736   CHOLHDL 2.5 04/18/2020 0736   LDLCALC 72 04/18/2020 0736    Physical Exam:     Physical Exam: Blood pressure 124/76, pulse 77, height 5' 11.5" (1.816 m), weight 190 lb 9.6 oz (86.5 kg), SpO2 96 %.  GEN:  Well nourished, middle age man , well developed in no acute distress HEENT: Normal NECK: No JVD; No carotid bruits LYMPHATICS: No lymphadenopathy CARDIAC: RRR , no murmurs, rubs, gallops RESPIRATORY:  Clear to auscultation without rales, wheezing or rhonchi  ABDOMEN: Soft, non-tender, non-distended MUSCULOSKELETAL:  No edema; No deformity  SKIN: Warm and dry NEUROLOGIC:  Alert and oriented x 3   ASSESSMENT:    1. Coronary artery calcification    PLAN:     Coronary artery calcifications:   CAC is 79 - 61st % for age, gender matched controls. Continue rosuvastatin .   LDL is 72. We discussed getting a NMR lipid profile which we will get in the next several weeks.  He is asymptomatic.   Exercises without any angina   2.  Hyperlipidemia:   Cont atorva.   Will get a NMR lipid profile.   Will consider adding zetia vs. Increasing rosuvastatin if needed.   I will see him back in a year.    Medication Adjustments/Labs and Tests Ordered: Current medicines are reviewed at length with the patient today.  Concerns regarding medicines are outlined above.  Orders Placed This Encounter  Procedures   NMR, lipoprofile   No orders of the defined types were placed in this encounter.   Patient Instructions  Medication Instructions:  Your physician recommends that you continue on your current medications as directed. Please refer to the Current Medication list given to you today.  *If you need a refill on your cardiac medications before your next appointment, please call your pharmacy*  Lab  Work: Fasting Lipid NMR - please call our office at [phone] to schedule.   If you have labs (blood work) drawn today and your tests are completely normal, you will receive your results only by:  MyChart Message (if you have MyChart) OR  A paper copy in the mail If you have any lab test that is abnormal or we need to change your treatment, we will call you to review the results.  Follow-Up: At [facility], you and your health needs are our priority.  As part of our continuing mission to provide you with exceptional heart care, we have created designated Provider Care Teams.  These Care Teams include your primary Cardiologist (physician) and Advanced Practice Providers (APPs -  Physician Assistants and Nurse Practitioners) who all work together to provide you with the care you need, when you need it.  We recommend signing up for the patient portal called "MyChart".  Sign up information is provided on this After Visit Summary.  MyChart is used to connect with patients for Virtual Visits (Telemedicine).  Patients are able to view lab/test results, encounter notes, upcoming appointments, etc.  Non-urgent messages can be sent to your provider as well.   To learn more about what you can do with MyChart, go to [email or web address].    Your next appointment:   1 year(s)  The format for your next appointment:   In Person  Provider:   You may see Kristeen Miss, MD or one of the following Advanced Practice Providers on your designated Care Team:    Tereso Newcomer, PA-C  [place]helsea Aus, [place]     Signed, Kristeen Miss, MD  04/25/2020 1:50 PM    [facility]

## 2020-04-25 ENCOUNTER — Ambulatory Visit (INDEPENDENT_AMBULATORY_CARE_PROVIDER_SITE_OTHER): Payer: PRIVATE HEALTH INSURANCE | Admitting: Cardiovascular Disease

## 2020-04-25 ENCOUNTER — Other Ambulatory Visit: Payer: Self-pay

## 2020-04-25 ENCOUNTER — Encounter: Payer: Self-pay | Admitting: Cardiovascular Disease

## 2020-04-25 VITALS — BP 124/76 | HR 77 | Ht 71.5 in | Wt 190.6 lb

## 2020-04-25 DIAGNOSIS — I2510 Atherosclerotic heart disease of native coronary artery without angina pectoris: Secondary | ICD-10-CM | POA: Diagnosis not present

## 2020-04-25 DIAGNOSIS — I2584 Coronary atherosclerosis due to calcified coronary lesion: Secondary | ICD-10-CM

## 2020-04-25 NOTE — Patient Instructions (Addendum)
Medication Instructions:  Your physician recommends that you continue on your current medications as directed. Please refer to the Current Medication list given to you today.  *If you need a refill on your cardiac medications before your next appointment, please call your pharmacy*  Lab Work: Fasting Lipid NMR - please call our office at [phone] to schedule.   If you have labs (blood work) drawn today and your tests are completely normal, you will receive your results only by: Marland Kitchen MyChart Message (if you have MyChart) OR . A paper copy in the mail If you have any lab test that is abnormal or we need to change your treatment, we will call you to review the results.  Follow-Up: At [facility], you and your health needs are our priority.  As part of our continuing mission to provide you with exceptional heart care, we have created designated Provider Care Teams.  These Care Teams include your primary Cardiologist (physician) and Advanced Practice Providers (APPs -  Physician Assistants and Nurse Practitioners) who all work together to provide you with the care you need, when you need it.  We recommend signing up for the patient portal called "MyChart".  Sign up information is provided on this After Visit Summary.  MyChart is used to connect with patients for Virtual Visits (Telemedicine).  Patients are able to view lab/test results, encounter notes, upcoming appointments, etc.  Non-urgent messages can be sent to your provider as well.   To learn more about what you can do with MyChart, go to [email or web address].    Your next appointment:   1 year(s)  The format for your next appointment:   In Person  Provider:   You may see Kristeen Miss, MD or one of the following Advanced Practice Providers on your designated Care Team:    Tereso Newcomer, PA-C  Vin [place], [place]

## 2021-02-08 ENCOUNTER — Other Ambulatory Visit: Payer: Self-pay | Admitting: Neurological Surgery

## 2021-02-08 ENCOUNTER — Ambulatory Visit (HOSPITAL_COMMUNITY)
Admission: RE | Admit: 2021-02-08 | Discharge: 2021-02-08 | Disposition: A | Discharge status: Home / Self Care | Payer: PRIVATE HEALTH INSURANCE | Source: Ambulatory Visit | Attending: Urology | Admitting: Urology

## 2021-02-08 ENCOUNTER — Other Ambulatory Visit: Payer: Self-pay

## 2021-02-08 DIAGNOSIS — M5416 Radiculopathy, lumbar region: Secondary | ICD-10-CM | POA: Diagnosis not present

## 2021-04-19 ENCOUNTER — Other Ambulatory Visit: Payer: Self-pay | Admitting: Urology

## 2021-04-19 DIAGNOSIS — R972 Elevated prostate specific antigen [PSA]: Secondary | ICD-10-CM

## 2021-05-01 ENCOUNTER — Other Ambulatory Visit: Payer: PRIVATE HEALTH INSURANCE

## 2021-05-01 ENCOUNTER — Other Ambulatory Visit: Payer: Self-pay

## 2021-05-01 ENCOUNTER — Ambulatory Visit
Admission: RE | Admit: 2021-05-01 | Discharge: 2021-05-01 | Disposition: A | Discharge status: Home / Self Care | Payer: PRIVATE HEALTH INSURANCE | Source: Ambulatory Visit | Attending: Urology | Admitting: Urology

## 2021-05-01 DIAGNOSIS — R972 Elevated prostate specific antigen [PSA]: Secondary | ICD-10-CM

## 2021-05-01 MED ORDER — GADOBENATE DIMEGLUMINE 529 MG/ML IV SOLN
18.0000 mL | Freq: Once | INTRAVENOUS | Status: AC | PRN
  Administered 2021-05-01: 18 mL via INTRAVENOUS

## 2021-05-05 ENCOUNTER — Other Ambulatory Visit: Payer: PRIVATE HEALTH INSURANCE

## 2021-10-02 ENCOUNTER — Ambulatory Visit: Payer: No Typology Code available for payment source | Admitting: Plastic Surgery

## 2021-10-09 ENCOUNTER — Other Ambulatory Visit: Payer: Self-pay

## 2021-10-09 ENCOUNTER — Other Ambulatory Visit (HOSPITAL_COMMUNITY)
Admission: RE | Admit: 2021-10-09 | Discharge: 2021-10-09 | Disposition: A | Discharge status: Home / Self Care | Payer: No Typology Code available for payment source | Source: Ambulatory Visit | Attending: Plastic Surgery | Admitting: Plastic Surgery

## 2021-10-09 ENCOUNTER — Ambulatory Visit (INDEPENDENT_AMBULATORY_CARE_PROVIDER_SITE_OTHER): Payer: Self-pay | Admitting: Plastic Surgery

## 2021-10-09 DIAGNOSIS — L989 Disorder of the skin and subcutaneous tissue, unspecified: Secondary | ICD-10-CM

## 2021-10-09 NOTE — Progress Notes (Signed)
Operative Note   DATE OF OPERATION: 10/09/2021  LOCATION:    SURGICAL DEPARTMENT: Plastic Surgery  PREOPERATIVE DIAGNOSES: Left neck skin lesion  POSTOPERATIVE DIAGNOSES:  same  PROCEDURE:  Excision of neck skin lesion measuring 2.5 cm Complex closure measuring 2.5 cm  SURGEON: Ancil Linsey, MD  ANESTHESIA:  Local  COMPLICATIONS: None.   INDICATIONS FOR PROCEDURE:  The patient, Jerry Kirby is a 66 y.o. male born on 1955-11-13, is here for treatment of left neck skin lesion MRN: [id number]  CONSENT:  Informed consent was obtained directly from the patient. Risks, benefits and alternatives were fully discussed. Specific risks including but not limited to bleeding, infection, hematoma, seroma, scarring, pain, infection, wound healing problems, and need for further surgery were all discussed. The patient did have an ample opportunity to have questions answered to satisfaction.   DESCRIPTION OF PROCEDURE:  Local anesthesia was administered. The patient's operative site was prepped and draped in a sterile fashion. A time out was performed and all information was confirmed to be correct.  The lesion was excised with a 15 blade.  Hemostasis was obtained.  Circumferential undermining was performed and the skin was advanced and closed in layers with interrupted buried Monocryl sutures and Monocryl for the skin.    The patient tolerated the procedure well.  There were no complications.

## 2021-10-12 LAB — SURGICAL PATHOLOGY

## 2022-01-03 ENCOUNTER — Telehealth: Payer: Self-pay | Admitting: Cardiovascular Disease

## 2022-01-03 NOTE — Telephone Encounter (Signed)
Detailed message left with MR Lattie Haw) to fax recent labs and EKG prior to appt with Dr Acie Fredrickson  .Adonis Housekeeper ?

## 2022-01-03 NOTE — Telephone Encounter (Signed)
? ?  Pt made an appt with Dr. Elease Hashimoto on 05/02, he said he had blood work done and ekg with Dr. Renne Crigler, he said if we have access to that so Dr. Elease Hashimoto can check it  ?

## 2022-01-22 ENCOUNTER — Encounter: Payer: Self-pay | Admitting: Cardiovascular Disease

## 2022-01-22 ENCOUNTER — Ambulatory Visit (INDEPENDENT_AMBULATORY_CARE_PROVIDER_SITE_OTHER): Payer: No Typology Code available for payment source | Admitting: Cardiovascular Disease

## 2022-01-22 VITALS — BP 118/76 | HR 70 | Ht 71.5 in | Wt 183.8 lb

## 2022-01-22 DIAGNOSIS — I2584 Coronary atherosclerosis due to calcified coronary lesion: Secondary | ICD-10-CM

## 2022-01-22 DIAGNOSIS — I2510 Atherosclerotic heart disease of native coronary artery without angina pectoris: Secondary | ICD-10-CM | POA: Diagnosis not present

## 2022-01-22 DIAGNOSIS — E782 Mixed hyperlipidemia: Secondary | ICD-10-CM | POA: Diagnosis not present

## 2022-01-22 NOTE — Patient Instructions (Signed)
Medication Instructions:  ?STOP Crestor ?*If you need a refill on your cardiac medications before your next appointment, please call your pharmacy* ? ? ?Lab Work: ?(Lipids, ALT, BMET in 3 months & send results) ?If you have labs (blood work) drawn today and your tests are completely normal, you will receive your results only by: ?MyChart Message (if you have MyChart) OR ?A paper copy in the mail ?If you have any lab test that is abnormal or we need to change your treatment, we will call you to review the results. ? ? ?Testing/Procedures: ?NONE ? ? ?Follow-Up: ?At [facility], you and your health needs are our priority.  As part of our continuing mission to provide you with exceptional heart care, we have created designated Provider Care Teams.  These Care Teams include your primary Cardiologist (physician) and Advanced Practice Providers (APPs -  Physician Assistants and Nurse Practitioners) who all work together to provide you with the care you need, when you need it. ? ?Your next appointment:   ?1 year(s) ? ?The format for your next appointment:   ?In Person ? ?Provider:   ?Kristeen Miss, MD   ? ?  ? ?Important Information About Sugar ? ? ? ? ?  ?

## 2022-01-22 NOTE — Progress Notes (Signed)
?Cardiology Office Note:   ? ?Date:  01/22/2022  ? ?ID:  Jerry Kirby, DOB 04/28/1956, MRN [id number] ? ?PCP:  Merri BrunettePharr, Walter, MD  ?Cardiologist:  Astor Gentle  ?Electrophysiologist:  None  ? ?Referring MD: Merri BrunettePharr, Walter, MD  ? ?Chief Complaint  ?Patient presents with  ? coronary artery calcificaton  ?  ? ?History of Present Illness:   ? ?Jerry Kirby is a 66 y.o. male with a hx of hyperlipidemia and a family history of premature coronary artery disease.  We are asked to see him today by Dr. Renne CriglerPharr  for further evaluation and management of some atherosclerosis/coronary calcifications found on a coronary calcium score 79 which  is 61st percentile for age / gender matched controlls.   ? ?Jerry Kirby is a very active 66 year old gentleman.  He is a competitive cyclist.. ? ?He had a coronary calcium score because of HLD.  coronary artery calcium score is 79 which is 61st percentile for patient's of matched age, gender and race/ethnicity ? ?Remains very active. ?Cycles regularly ,   ?Has had several episodes of SVT ( or perhaps rapid sinus tach)  ?No CP, dyspne,   ?No syncope or presyncope  ?Has some some orthostasis .  ? ?Off and on with the rosuvastatin  ?Recent labs at his primary medical doctor's office reveals a total cholesterol of 162.  The triglyceride level is 51.  The HDL is 64.  The LDL is 88. ? ?April 25, 2020:  ?Jerry Kirby is seen today for follow-up visit.  He is an 66 year old gentleman with a history of hyperlipidemia and family history of premature coronary artery disease. ? ?Coronary artery calcium score is 79 which is 61st percentile for patient's of matched age, gender and race/ethnicity ? ?He was started on rosuvastatin 10 mg a day.  Recent labs reveal total cholesterol of 149.  The HDL is 59.  The triglyceride level is 95.  The LDL is 72.  His glucose levels 106. ? ?Father's side of family has CAD and carotid disease.  ? ?[place]  ?We discussed getting a lipid NMR panel  ? ?Jan 22, 2022   ? ?Here to discuss lipid lowering therapy ?Has developed glucose intolerance ?HBA1C is 6.2  ?Fasting glucose was 111.  ?Wearing continueous glucose monitor currently  ?Never below 100  ?Is cycling on a regular basis.   Is eating more carbs to keep his weight up ?Wants to discuss changing from rosuvastatin to another medication  ? ?He would like to stop the rosuvastatin ?And see if his numbers improve  ?We discussed the PSSK9 inhibitors. ?Would like to see how he does on no lipid lowering meds for now .  We will wait and start any additional meds after his glucose intolerance issues are addressed.  ? ?Past Medical History:  ?Diagnosis Date  ? Abnormal bladder cytology   ? ADD (attention deficit disorder)   ? Atherosclerotic heart disease of native coronary artery without angina pectoris   ? BPH without obstruction/lower urinary tract symptoms   ? Calcification of native coronary artery   ? DDD (degenerative disc disease), cervical   ? DDD (degenerative disc disease), lumbar   ? Family history of premature CAD   ? Frequency of urination   ? Gross hematuria 05-04-12  ? none at present time  ? H/O hypercholesterolemia 05-04-12  ? no current medical interventions  ? Hypercholesterolemia   ? Hyperkalemia   ? Nephrolithiasis   ? Renal stone  right  ? Retinal detachment   ? Thumb fracture   ? ? ?Past Surgical History:  ?Procedure Laterality Date  ? BIOPSY    ? Procedure: BIOPSY;  Surgeon: Garnett Farm, MD;  Location: [facility];  Service: Urology;  Laterality: N/A;  Bladder Biopsy  ? CYSTOSCOPY W/ URETERAL STENT PLACEMENT  08/30/2011  ? Procedure: CYSTOSCOPY WITH RETROGRADE PYELOGRAM/URETERAL STENT PLACEMENT;  Surgeon: Garnett Farm, MD;  Location: [facility];  Service: Urology;  Laterality: Bilateral;  ? LEFT THUMG SURG.  1994  ? NEPHROLITHOTOMY  05/08/2012  ? Procedure: NEPHROLITHOTOMY PERCUTANEOUS;  Surgeon: Garnett Farm, MD;  Location: [facility];  Service: Urology;  Laterality: Left;  ?  PROSTATE BIOPSY  08/30/2011  ? Procedure: PROSTATE BIOPSY;  Surgeon: Garnett Farm, MD;  Location: [facility];  Service: Urology;  Laterality: N/A;  ? REFRACTIVE SURGERY  NOV 2012  ? RIGHT  ? RETINAL DETACHMENT SURGERY  FEB 2012  ? RIGHT EYE  ? `    ? ? ?Current Medications: ?Current Meds  ?Medication Sig  ? acetaminophen (TYLENOL) 500 MG tablet Take 1,000 mg by mouth every 6 (six) hours as needed for mild pain.  ? Continuous Blood Gluc Sensor (FREESTYLE LIBRE 3 SENSOR) MISC USE EVERY 14 DAYS AS DIRECTED  ? ibuprofen (ADVIL,MOTRIN) 200 MG tablet Take 800 mg by mouth every 6 (six) hours as needed for mild pain or moderate pain.  ?  ? ?Allergies:   Patient has no known allergies.  ? ?Social History  ? ?Socioeconomic History  ? Marital status: Married  ?  Spouse name: Not on file  ? Number of children: Not on file  ? Years of education: Not on file  ? Highest education level: Not on file  ?Occupational History  ? Not on file  ?Tobacco Use  ? Smoking status: Never  ? Smokeless tobacco: Never  ?Substance and Sexual Activity  ? Alcohol use: Yes  ?  Alcohol/week: 2.0 standard drinks  ?  Types: 2 Glasses of wine per week  ? Drug use: No  ? Sexual activity: Yes  ?Other Topics Concern  ? Not on file  ?Social History Narrative  ? Not on file  ? ?Social Determinants of Health  ? ?Financial Resource Strain: Not on file  ?Food Insecurity: Not on file  ?Transportation Needs: Not on file  ?Physical Activity: Not on file  ?Stress: Not on file  ?Social Connections: Not on file  ?  ? ?Family History: ?The patient's family history includes CAD in his father; Cancer in his father and mother; Healthy in his daughter and son. ? ?ROS:   ?Please see the history of present illness.    ? All other systems reviewed and are negative. ? ?EKGs/Labs/Other Studies Reviewed:   ? ?The following studies were reviewed today: ? ? ?EKG:    ? ?Recent Labs: ?No results found for requested labs within last 8760 hours.  ?Recent Lipid Panel ?    ?Component Value Date/Time  ? CHOL 149 04/18/2020 0736  ? TRIG 95 04/18/2020 0736  ? HDL 59 04/18/2020 0736  ? CHOLHDL 2.5 04/18/2020 0736  ? LDLCALC 72 04/18/2020 0736  ? ? ?Physical Exam:   ? ? ?Physical Exam: ?Blood pressure 118/76, pulse 70, height 5' 11.5" (1.816 m), weight 183 lb 12.8 oz (83.4 kg), SpO2 97 %. ? ?GEN:  Well nourished, well developed in no acute distress ?HEENT: Normal ?NECK: No JVD; No carotid bruits ?LYMPHATICS: No lymphadenopathy ?  CARDIAC: RRR , no murmurs, rubs, gallops ?RESPIRATORY:  Clear to auscultation without rales, wheezing or rhonchi  ?ABDOMEN: Soft, non-tender, non-distended ?MUSCULOSKELETAL:  No edema; No deformity  ?SKIN: Warm and dry ?NEUROLOGIC:  Alert and oriented x 3 ? ? ? ?ASSESSMENT:   ? ?1. Coronary artery calcification   ?2. Mixed hyperlipidemia   ? ? ?PLAN:   ? ? ?Coronary artery calcifications:   CAC is 79 - 61st % for age,   ? ?2.  Hyperlipidemia:    ?He is concerned that the rosuvastatin might be causing some glucose intolerance or type 2 diabetes mellitus.  While it is associated with glucose elevations of not see it be elevated important clinically.  He has developed diabetes and was previously nondiabetic. ? ?He will work on improving his diet.  We will stop the rosuvastatin and see if his glucose levels and hemoglobin A1c improves.  We can consider starting an PCSK9 inhibitor perhaps inclisiran to see if that controls his lipids. ? ?  ? ?Medication Adjustments/Labs and Tests Ordered: ?Current medicines are reviewed at length with the patient today.  Concerns regarding medicines are outlined above.  ?No orders of the defined types were placed in this encounter. ? ?No orders of the defined types were placed in this encounter. ? ? ?Patient Instructions  ?Medication Instructions:  ?STOP Crestor ?*If you need a refill on your cardiac medications before your next appointment, please call your pharmacy* ? ? ?Lab Work: ?(Lipids, ALT, BMET in 3 months & send results) ?If  you have labs (blood work) drawn today and your tests are completely normal, you will receive your results only by: ?MyChart Message (if you have MyChart) OR ?A paper copy in the mail ?If you have any lab

## 2023-01-07 ENCOUNTER — Other Ambulatory Visit: Payer: Self-pay | Admitting: Urology

## 2023-01-08 ENCOUNTER — Encounter (HOSPITAL_BASED_OUTPATIENT_CLINIC_OR_DEPARTMENT_OTHER): Payer: Self-pay | Admitting: Urology

## 2023-01-08 NOTE — Progress Notes (Signed)
Attempted pre-op phone call. Patient to have in person instruction at noon.

## 2023-01-08 NOTE — Progress Notes (Signed)
In person pre-op instructions given. Procedure date and arrival time confirmed. Patient medical history, medications, and allergies verified. Patient advised not to have Metformin DOS. Patient stated he does not take Aspirin and has not had Ibuprofen in months. Patient still advised not to have Ibuprofen 48 hours prior and Aspirin 72 hours prior. Patient denies any recent history of chest pain or COVID. Driver secured.

## 2023-01-10 ENCOUNTER — Other Ambulatory Visit: Payer: Self-pay

## 2023-01-10 ENCOUNTER — Ambulatory Visit (HOSPITAL_BASED_OUTPATIENT_CLINIC_OR_DEPARTMENT_OTHER)
Admission: RE | Admit: 2023-01-10 | Discharge: 2023-01-10 | Disposition: A | Discharge status: Home / Self Care | Payer: No Typology Code available for payment source | Attending: Urology | Admitting: Urology

## 2023-01-10 ENCOUNTER — Encounter (HOSPITAL_BASED_OUTPATIENT_CLINIC_OR_DEPARTMENT_OTHER)
Admission: RE | Disposition: A | Discharge status: Home / Self Care | Payer: Self-pay | Source: Home / Self Care | Attending: Urology

## 2023-01-10 ENCOUNTER — Encounter (HOSPITAL_BASED_OUTPATIENT_CLINIC_OR_DEPARTMENT_OTHER): Payer: Self-pay | Admitting: Urology

## 2023-01-10 ENCOUNTER — Ambulatory Visit (HOSPITAL_COMMUNITY): Payer: No Typology Code available for payment source

## 2023-01-10 DIAGNOSIS — E119 Type 2 diabetes mellitus without complications: Secondary | ICD-10-CM | POA: Insufficient documentation

## 2023-01-10 DIAGNOSIS — N201 Calculus of ureter: Secondary | ICD-10-CM | POA: Diagnosis present

## 2023-01-10 DIAGNOSIS — Z7984 Long term (current) use of oral hypoglycemic drugs: Secondary | ICD-10-CM | POA: Diagnosis not present

## 2023-01-10 HISTORY — DX: Prediabetes: R73.03

## 2023-01-10 HISTORY — DX: Pure hypercholesterolemia, unspecified: E78.00

## 2023-01-10 HISTORY — PX: EXTRACORPOREAL SHOCK WAVE LITHOTRIPSY: SHX1557

## 2023-01-10 SURGERY — LITHOTRIPSY, ESWL
Anesthesia: LOCAL | Laterality: Left

## 2023-01-10 MED ORDER — SODIUM CHLORIDE 0.9 % IV SOLN: INTRAVENOUS | Status: DC

## 2023-01-10 MED ORDER — CIPROFLOXACIN HCL 500 MG PO TABS
500.0000 mg | ORAL_TABLET | ORAL | Status: AC
  Administered 2023-01-10: 500 mg via ORAL

## 2023-01-10 MED ORDER — DIPHENHYDRAMINE HCL 25 MG PO CAPS
ORAL_CAPSULE | ORAL | Status: AC
  Filled 2023-01-10: qty 1

## 2023-01-10 MED ORDER — CIPROFLOXACIN HCL 500 MG PO TABS
ORAL_TABLET | ORAL | Status: AC
  Filled 2023-01-10: qty 1

## 2023-01-10 MED ORDER — DIPHENHYDRAMINE HCL 25 MG PO CAPS
25.0000 mg | ORAL_CAPSULE | ORAL | Status: AC
  Administered 2023-01-10: 25 mg via ORAL

## 2023-01-10 MED ORDER — DIAZEPAM 5 MG PO TABS
ORAL_TABLET | ORAL | Status: AC
  Filled 2023-01-10: qty 2

## 2023-01-10 MED ORDER — DIAZEPAM 5 MG PO TABS
10.0000 mg | ORAL_TABLET | ORAL | Status: AC
  Administered 2023-01-10: 10 mg via ORAL

## 2023-01-10 NOTE — Discharge Instructions (Signed)
  Post Anesthesia Home Care Instructions  Activity: Get plenty of rest for the remainder of the day. A responsible adult should stay with you for 24 hours following the procedure.  For the next 24 hours, DO NOT: -Drive a car -Advertising copywriter -Drink alcoholic beverages -Take any medication unless instructed by your physician -Make any legal decisions or sign important papers.  Meals: Start with liquid foods such as gelatin or soup. Progress to regular foods as tolerated. Avoid greasy, spicy, heavy foods. If nausea and/or vomiting occur, drink only clear liquids until the nausea and/or vomiting subsides. Call your physician if vomiting continues.  Special Instructions/Symptoms:

## 2023-01-10 NOTE — H&P (Signed)
See scanned H&P

## 2023-01-10 NOTE — Op Note (Signed)
Unable to identify stone on flouro

## 2023-01-14 ENCOUNTER — Encounter (HOSPITAL_BASED_OUTPATIENT_CLINIC_OR_DEPARTMENT_OTHER): Payer: Self-pay | Admitting: Urology

## 2023-01-23 ENCOUNTER — Other Ambulatory Visit: Payer: Self-pay | Admitting: Urology

## 2023-01-27 ENCOUNTER — Other Ambulatory Visit: Payer: Self-pay | Admitting: Urology

## 2023-01-27 ENCOUNTER — Encounter (HOSPITAL_BASED_OUTPATIENT_CLINIC_OR_DEPARTMENT_OTHER): Payer: Self-pay | Admitting: Urology

## 2023-01-27 ENCOUNTER — Encounter (HOSPITAL_BASED_OUTPATIENT_CLINIC_OR_DEPARTMENT_OTHER)
Admission: RE | Disposition: A | Discharge status: Home / Self Care | Payer: Self-pay | Source: Home / Self Care | Attending: Urology

## 2023-01-27 ENCOUNTER — Ambulatory Visit (HOSPITAL_BASED_OUTPATIENT_CLINIC_OR_DEPARTMENT_OTHER)
Admission: RE | Admit: 2023-01-27 | Discharge: 2023-01-27 | Disposition: A | Discharge status: Home / Self Care | Payer: No Typology Code available for payment source | Attending: Urology | Admitting: Urology

## 2023-01-27 ENCOUNTER — Ambulatory Visit (HOSPITAL_COMMUNITY): Payer: No Typology Code available for payment source

## 2023-01-27 DIAGNOSIS — Z7984 Long term (current) use of oral hypoglycemic drugs: Secondary | ICD-10-CM | POA: Insufficient documentation

## 2023-01-27 DIAGNOSIS — N201 Calculus of ureter: Secondary | ICD-10-CM | POA: Insufficient documentation

## 2023-01-27 DIAGNOSIS — E7800 Pure hypercholesterolemia, unspecified: Secondary | ICD-10-CM | POA: Insufficient documentation

## 2023-01-27 DIAGNOSIS — Z79899 Other long term (current) drug therapy: Secondary | ICD-10-CM | POA: Insufficient documentation

## 2023-01-27 DIAGNOSIS — E119 Type 2 diabetes mellitus without complications: Secondary | ICD-10-CM | POA: Diagnosis not present

## 2023-01-27 DIAGNOSIS — Z87442 Personal history of urinary calculi: Secondary | ICD-10-CM | POA: Insufficient documentation

## 2023-01-27 HISTORY — PX: EXTRACORPOREAL SHOCK WAVE LITHOTRIPSY: SHX1557

## 2023-01-27 SURGERY — LITHOTRIPSY, ESWL
Anesthesia: LOCAL | Laterality: Left

## 2023-01-27 MED ORDER — DIAZEPAM 5 MG PO TABS
10.0000 mg | ORAL_TABLET | ORAL | Status: AC
  Administered 2023-01-27: 10 mg via ORAL

## 2023-01-27 MED ORDER — HYDROMORPHONE HCL 1 MG/ML IJ SOLN
0.5000 mg | INTRAMUSCULAR | Status: DC | PRN
  Administered 2023-01-27: 0.5 mg via INTRAVENOUS

## 2023-01-27 MED ORDER — CIPROFLOXACIN HCL 500 MG PO TABS
500.0000 mg | ORAL_TABLET | ORAL | Status: AC
  Administered 2023-01-27: 500 mg via ORAL

## 2023-01-27 MED ORDER — DIPHENHYDRAMINE HCL 25 MG PO CAPS
ORAL_CAPSULE | ORAL | Status: AC
  Filled 2023-01-27: qty 1

## 2023-01-27 MED ORDER — DIAZEPAM 5 MG PO TABS
ORAL_TABLET | ORAL | Status: AC
  Filled 2023-01-27: qty 2

## 2023-01-27 MED ORDER — DIPHENHYDRAMINE HCL 25 MG PO CAPS
25.0000 mg | ORAL_CAPSULE | ORAL | Status: AC
  Administered 2023-01-27: 25 mg via ORAL

## 2023-01-27 MED ORDER — SODIUM CHLORIDE 0.9 % IV SOLN: INTRAVENOUS | Status: DC

## 2023-01-27 MED ORDER — HYDROMORPHONE HCL 1 MG/ML IJ SOLN
INTRAMUSCULAR | Status: AC
  Filled 2023-01-27: qty 1

## 2023-01-27 MED ORDER — CIPROFLOXACIN HCL 500 MG PO TABS
ORAL_TABLET | ORAL | Status: AC
  Filled 2023-01-27: qty 1

## 2023-01-27 NOTE — Discharge Instructions (Signed)
°  Post Anesthesia Home Care Instructions ° °Activity: °Get plenty of rest for the remainder of the day. A responsible adult should stay with you for 24 hours following the procedure.  °For the next 24 hours, DO NOT: °-Drive a car °-Operate machinery °-Drink alcoholic beverages °-Take any medication unless instructed by your physician °-Make any legal decisions or sign important papers. ° °Meals: °Start with liquid foods such as gelatin or soup. Progress to regular foods as tolerated. Avoid greasy, spicy, heavy foods. If nausea and/or vomiting occur, drink only clear liquids until the nausea and/or vomiting subsides. Call your physician if vomiting continues. ° ° °  ° °

## 2023-01-27 NOTE — Op Note (Addendum)
Left 7 mm ureteral stone   Left ESWL  Findings: stone located at left L4. Treated at 60. Faded well and then visibly began to move proximally. We stopped treatment and watch a fragment go up and laterally and likely settle in the left renal pelvis or UPJ. We could see the left MP stone laterally. We had patient sit on side of bed to see if fragment would drop back down. On flouro the stone fragment remains up near or at the left UPJ and I would estimate it at 3-4 mm. He may need a staged procedure if he fails to pass the stone or stone fragments.

## 2023-01-27 NOTE — OR Nursing (Signed)
States  no urge to void. States he prefers to be discharged to home. Margarita Mail rn

## 2023-01-27 NOTE — H&P (Signed)
Office Visit Report     01/27/2023   --------------------------------------------------------------------------------   Jerry Kirby. Zur  MRN: [id number]  DOB: 1956/05/15, 67 year old Male  SSN: -[id number]   PRIMARY CARE:     REFERRING:    PROVIDER:  Heloise Purpura, M.D.  TREATING:  [name], M.D.  LOCATION:  [facility] 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 His stones have been composed of calcium phosphate and calcium oxalate. He has had ongoing, intermittent left groin pain that is similar to his prior stone episodes. Due to persistent pain symptoms, he underwent a CT stone study today for further evaluation. This did demonstrate a 9 x 7 mm proximal left ureteral calculus measuring approximately 900 Hounsfield units. He also has a 7 mm LMP stone. He currently is minimally symptomatic. He denies any fever. His urinalysis is currently pending.   01/27/2023: He underwent attempted left ESWL 01/10/2023 but the stone was not visualized. Due to significant bowel gas, he declined contrast. F/u US and KUB in office 01/21/2023 showed the stone has moved back up into the kidney. He developed recurrent left flank pain yesterday. He attempted [place] and peloton to get the stone to move. KUB today shows the 7 mm stone in the left proximal ureter. He is otherwise well with no fever or dysuria. He has had rust colored urine.     ALLERGIES: No Allergies    MEDICATIONS: Metformin Hcl  Freestyle Libre 3 Sensor each Apply 1 sensor to the triceps area q14 days  Hydromorphone Hcl 2 mg tablet 1 tablet PO q 3 hr PRN  Rosuvastatin Calcium 10 mg tablet 1 tablet PO Daily  Silodosin 8 mg capsule 1 capsule PO Daily     GU PSH: Cysto Bladder Ureth Biopsy - 2013 Cysto Uretero Lithotripsy - 2013 Cystoscopy Insert Stent - 2013 ESWL -  2013 Prostate Needle Biopsy - 05/08/2021, 2012 Ureteroscopic stone removal - 2013 Vasectomy - 2012       PSH Notes: Lithotripsy, Cystoscopy With Ureteroscopy With Lithotripsy, Cystoscopy With Pyeloscopy With Removal Of Calculus, Cystoscopy With Biopsy, Cystoscopy With Insertion Of Ureteral Stent Bilateral, Biopsy Of The Prostate Needle, Neurorrhaphy Left Thumb, Surgery Of Male Genitalia Vasectomy, Repair Of Retinal Detachment By Laser, Repair Retinal Detachment With Scleral Buckling With Implant   NON-GU PSH: Repair Detached Retina - 2012 Repair Detached Retina - 2012 Repair Digit Nerve - 2012 Surgical Pathology, Gross And Microscopic Examination For Prostate Needle - 05/08/2021     GU PMH: History of urolithiasis - 01/21/2023, - 01/07/2023 Renal calculus - 01/07/2023 Elevated PSA - 05/08/2021, - 04/19/2021, Elevated prostate specific antigen (PSA), - 2014 Ureteral calculus, Calculus of left ureter - 2015, Mid Ureteral Stone On The Left, - 2014, Ureteral Stone, - 2014 Abnormal urine findings, Unspec, Abnormal urine findings - 2014 BPH w/o LUTS, Benign prostatic hypertrophy without lower urinary tract symptoms - 2014 Encounter for Prostate Cancer screening, Visit For: Screening Exam Malignant Neoplasm Prostate - 2014 Other microscopic hematuria, Microscopic hematuria - 2014 Prostate Stones, Calculus of prostate - 2014 Renal cyst, Renal cysts, acquired, bilateral - 2014      PMH Notes:  2011-08-05 08:11:04 - Note: Retinal Detachment  2011-09-02 08:45:26 - Note: Nephrolithiasis Of Both Kidneys  2011-11-21 14:47:19 - Note: Sacral Radiculopathy At S3   NON-GU PMH: Encounter for general adult medical examination without abnormal findings, Encounter for preventive health examination - 2015 Neuralgia and  neuritis, unspecified, Radicular pain in left arm - 2015 Radiculopathy, site unspecified, Radicular pain of left lower extremity - 2015 Acute sinusitis, unspecified, Acute Sinusitis -  2014 Personal history of other endocrine, nutritional and metabolic disease, History of hypercholesterolemia - 2014 Diabetes Type 2 Hypercholesterolemia    FAMILY HISTORY: Breast Cancer - Mother Coronary Artery Disease - Father Malignant Melanoma Of The Skin - Father Prostate Cancer - Father   SOCIAL HISTORY: Marital Status: Married Race: White Current Smoking Status: Patient has never smoked.   Tobacco Use Assessment Completed: Used Tobacco in last 30 days? Drinks 4 drinks per week. Types of alcohol consumed: Beer, Liquor, Wine. Light Drinker.  Does not use drugs. Drinks 2 caffeinated drinks per day. Patient's occupation Artist.     Notes: Caffeine Use, Alcohol Use, Marital History - Currently Married, Never A Smoker   REVIEW OF SYSTEMS:    GU Review Male:   Patient denies frequent urination, hard to postpone urination, burning/ pain with urination, get up at night to urinate, leakage of urine, stream starts and stops, trouble starting your stream, have to strain to urinate , erection problems, and penile pain.  Gastrointestinal (Upper):   Patient denies nausea, vomiting, and indigestion/ heartburn.  Gastrointestinal (Lower):   Patient denies diarrhea and constipation.  Constitutional:   Patient denies fever, night sweats, weight loss, and fatigue.  Skin:   Patient denies skin rash/ lesion and itching.  Eyes:   Patient denies blurred vision and double vision.  Ears/ Nose/ Throat:   Patient denies sore throat and sinus problems.  Hematologic/Lymphatic:   Patient denies swollen glands and easy bruising.  Cardiovascular:   Patient denies leg swelling and chest pains.  Respiratory:   Patient denies cough and shortness of breath.  Endocrine:   Patient denies excessive thirst.  Musculoskeletal:   Patient denies back pain and joint pain.  Neurological:   Patient denies headaches and dizziness.  Psychologic:   Patient denies depression and anxiety.   VITAL SIGNS: None    MULTI-SYSTEM PHYSICAL EXAMINATION:    Constitutional: Well-nourished. No physical deformities. Normally developed. Good grooming.  Neck: Neck symmetrical, not swollen. Normal tracheal position.  Respiratory: No labored breathing, no use of accessory muscles.   Cardiovascular: Normal temperature, normal extremity pulses, no swelling, no varicosities.  Skin: No paleness, no jaundice, no cyanosis. No lesion, no ulcer, no rash.  Neurologic / Psychiatric: Oriented to time, oriented to place, oriented to person. No depression, no anxiety, no agitation.  Gastrointestinal: No mass, no tenderness, no rigidity, non obese abdomen.     Complexity of Data:  X-Ray Review: KUB: Reviewed Films. 2024 C.T. Abdomen/Pelvis: Reviewed Films. Discussed With Patient. 2024    12/10/22 12/11/21 04/17/21 01/10/21 12/05/20 12/01/19 11/25/18 09/25/17  PSA  Total PSA 4.0 ng/ml 3.76 ng/ml 5.97 ng/mL 4.4 ng/ml 6.2 ng/ml 2.28 ng/ml 2.83 ng/ml 1.99 ng/ml    PROCEDURES: None   ASSESSMENT:      ICD-10 Details  1 GU:   Ureteral calculus - N20.1 Chronic, Stable - after considering the nature r/b/a to left ESWL he elects to proceed. He has taken NSAIDs but stone is outside the renal shadow as we discussed bleeding risk among others. If we are not able to see the stone today he does consent to contrast and if still we are not able to target the stone with left ESWL we will proceed with cystoscopy, left URS/HLL/stent. He understands he may need a staged procedure.    PLAN:  Schedule Return Visit/Planned Activity: ASAP - Schedule Surgery          Document Letter(s):  Created for Patient: Clinical Summary    * Signed by [name], M.D. on 01/27/23 at 9:08 AM (EDT)*

## 2023-01-27 NOTE — Interval H&P Note (Signed)
History and Physical Interval Note:  01/27/2023 3:42 PM  Jerry Kirby  has presented today for surgery, with the diagnosis of LEFT PROXIMAL URETERAL STONE.  The various methods of treatment have been discussed with the patient and family. After consideration of risks, benefits and other options for treatment, the patient has consented to  Procedure(s): LEFT EXTRACORPOREAL SHOCK WAVE LITHOTRIPSY (ESWL) (Left) as a surgical intervention.  The patient's history has been reviewed, patient examined, no change in status, stable for surgery.  I have reviewed the patient's chart and labs.  Questions were answered to the patient's satisfaction.  Stone is visible on flouro on the truck.    [name]

## 2023-01-27 NOTE — Progress Notes (Signed)
Talked with Dr. Annabell Howells. Arrival time 1300. NPO after 0700 AM. Aware of instructions and procedure

## 2023-01-29 ENCOUNTER — Encounter (HOSPITAL_BASED_OUTPATIENT_CLINIC_OR_DEPARTMENT_OTHER): Payer: Self-pay | Admitting: Urology
